# Patient Record
Sex: Male | Born: 1975 | Race: White | Hispanic: No | Marital: Single | Smoking: Current every day smoker
Health system: Southern US, Community
[De-identification: ages and names within clinical notes are randomized; demographics above are authoritative.]

---

## 2020-09-11 ENCOUNTER — Encounter (HOSPITAL_COMMUNITY): Payer: Self-pay

## 2020-09-11 ENCOUNTER — Observation Stay (HOSPITAL_COMMUNITY): Payer: No Typology Code available for payment source | Admitting: Anesthesiology

## 2020-09-11 ENCOUNTER — Observation Stay (HOSPITAL_COMMUNITY): Payer: Self-pay

## 2020-09-11 ENCOUNTER — Emergency Department (HOSPITAL_COMMUNITY): Payer: Self-pay

## 2020-09-11 ENCOUNTER — Observation Stay (HOSPITAL_COMMUNITY): Payer: Self-pay | Attending: Orthopedic Surgery

## 2020-09-11 ENCOUNTER — Encounter (HOSPITAL_COMMUNITY)
Admission: EM | Disposition: A | Discharge status: Home / Self Care | Payer: Self-pay | Source: Home / Self Care | Attending: Emergency Medicine

## 2020-09-11 ENCOUNTER — Other Ambulatory Visit: Payer: Self-pay

## 2020-09-11 ENCOUNTER — Observation Stay (HOSPITAL_COMMUNITY)
Admission: EM | Admit: 2020-09-11 | Discharge: 2020-09-12 | Disposition: A | Discharge status: Home / Self Care | Payer: No Typology Code available for payment source | Attending: Orthopedic Surgery | Admitting: Orthopedic Surgery

## 2020-09-11 DIAGNOSIS — W208XXA Other cause of strike by thrown, projected or falling object, initial encounter: Secondary | ICD-10-CM | POA: Diagnosis not present

## 2020-09-11 DIAGNOSIS — Z20822 Contact with and (suspected) exposure to covid-19: Secondary | ICD-10-CM | POA: Insufficient documentation

## 2020-09-11 DIAGNOSIS — S8991XA Unspecified injury of right lower leg, initial encounter: Secondary | ICD-10-CM | POA: Diagnosis present

## 2020-09-11 DIAGNOSIS — S82301B Unspecified fracture of lower end of right tibia, initial encounter for open fracture type I or II: Secondary | ICD-10-CM | POA: Diagnosis not present

## 2020-09-11 DIAGNOSIS — Y9269 Other specified industrial and construction area as the place of occurrence of the external cause: Secondary | ICD-10-CM | POA: Diagnosis not present

## 2020-09-11 DIAGNOSIS — S82209B Unspecified fracture of shaft of unspecified tibia, initial encounter for open fracture type I or II: Secondary | ICD-10-CM | POA: Diagnosis present

## 2020-09-11 DIAGNOSIS — S82401A Unspecified fracture of shaft of right fibula, initial encounter for closed fracture: Secondary | ICD-10-CM | POA: Insufficient documentation

## 2020-09-11 DIAGNOSIS — Y990 Civilian activity done for income or pay: Secondary | ICD-10-CM | POA: Diagnosis not present

## 2020-09-11 DIAGNOSIS — S82201A Unspecified fracture of shaft of right tibia, initial encounter for closed fracture: Secondary | ICD-10-CM

## 2020-09-11 DIAGNOSIS — Z419 Encounter for procedure for purposes other than remedying health state, unspecified: Secondary | ICD-10-CM

## 2020-09-11 HISTORY — PX: TIBIA IM NAIL INSERTION: SHX2516

## 2020-09-11 HISTORY — PX: I & D EXTREMITY: SHX5045

## 2020-09-11 LAB — CBC WITH DIFFERENTIAL/PLATELET
Abs Immature Granulocytes: 0.06 10*3/uL (ref 0.00–0.07)
Basophils Absolute: 0 10*3/uL (ref 0.0–0.1)
Basophils Relative: 0 %
Eosinophils Absolute: 0.2 10*3/uL (ref 0.0–0.5)
Eosinophils Relative: 1 %
HCT: 41.5 % (ref 39.0–52.0)
Hemoglobin: 14 g/dL (ref 13.0–17.0)
Immature Granulocytes: 1 %
Lymphocytes Relative: 11 %
Lymphs Abs: 1.2 10*3/uL (ref 0.7–4.0)
MCH: 32.3 pg (ref 26.0–34.0)
MCHC: 33.7 g/dL (ref 30.0–36.0)
MCV: 95.8 fL (ref 80.0–100.0)
Monocytes Absolute: 0.6 10*3/uL (ref 0.1–1.0)
Monocytes Relative: 5 %
Neutro Abs: 8.9 10*3/uL — ABNORMAL HIGH (ref 1.7–7.7)
Neutrophils Relative %: 82 %
Platelets: 228 10*3/uL (ref 150–400)
RBC: 4.33 MIL/uL (ref 4.22–5.81)
RDW: 12.4 % (ref 11.5–15.5)
WBC: 10.9 10*3/uL — ABNORMAL HIGH (ref 4.0–10.5)
nRBC: 0 % (ref 0.0–0.2)

## 2020-09-11 LAB — BASIC METABOLIC PANEL
Anion gap: 5 (ref 5–15)
BUN: 14 mg/dL (ref 6–20)
CO2: 25 mmol/L (ref 22–32)
Calcium: 8.7 mg/dL — ABNORMAL LOW (ref 8.9–10.3)
Chloride: 107 mmol/L (ref 98–111)
Creatinine, Ser: 1.16 mg/dL (ref 0.61–1.24)
GFR, Estimated: >60 mL/min (ref >60)
Glucose, Bld: 115 mg/dL — ABNORMAL HIGH (ref 70–99)
Potassium: 4.2 mmol/L (ref 3.5–5.1)
Sodium: 137 mmol/L (ref 135–145)

## 2020-09-11 LAB — SARS CORONAVIRUS 2 (TAT 6-24 HRS): SARS Coronavirus 2: NEGATIVE

## 2020-09-11 SURGERY — INSERTION, INTRAMEDULLARY ROD, TIBIA
Anesthesia: General | Laterality: Right

## 2020-09-11 MED ORDER — OXYCODONE HCL 5 MG PO TABS
5.0000 mg | ORAL_TABLET | ORAL | Status: DC | PRN
  Administered 2020-09-11 – 2020-09-12 (×2): 10 mg via ORAL
  Filled 2020-09-11 (×2): qty 2

## 2020-09-11 MED ORDER — ACETAMINOPHEN 500 MG PO TABS
ORAL_TABLET | ORAL | Status: AC
  Administered 2020-09-11: 1000 mg via ORAL
  Filled 2020-09-11: qty 2

## 2020-09-11 MED ORDER — CHLORHEXIDINE GLUCONATE 4 % EX LIQD: 60.0000 mL | Freq: Once | CUTANEOUS | Status: DC

## 2020-09-11 MED ORDER — EPHEDRINE SULFATE 50 MG/ML IJ SOLN
INTRAMUSCULAR | Status: DC | PRN
  Administered 2020-09-11: 5 mg via INTRAVENOUS

## 2020-09-11 MED ORDER — ORAL CARE MOUTH RINSE: 15.0000 mL | Freq: Once | OROMUCOSAL | Status: AC

## 2020-09-11 MED ORDER — LIDOCAINE HCL (CARDIAC) PF 100 MG/5ML IV SOSY
PREFILLED_SYRINGE | INTRAVENOUS | Status: DC | PRN
  Administered 2020-09-11: 60 mg via INTRATRACHEAL

## 2020-09-11 MED ORDER — MIDAZOLAM HCL 2 MG/2ML IJ SOLN
INTRAMUSCULAR | Status: AC
  Filled 2020-09-11: qty 2

## 2020-09-11 MED ORDER — FENTANYL CITRATE (PF) 250 MCG/5ML IJ SOLN
INTRAMUSCULAR | Status: DC | PRN
  Administered 2020-09-11: 100 ug via INTRAVENOUS
  Administered 2020-09-11: 50 ug via INTRAVENOUS
  Administered 2020-09-11: 100 ug via INTRAVENOUS
  Administered 2020-09-11: 50 ug via INTRAVENOUS
  Administered 2020-09-11: 100 ug via INTRAVENOUS

## 2020-09-11 MED ORDER — ONDANSETRON HCL 4 MG/2ML IJ SOLN
INTRAMUSCULAR | Status: DC | PRN
  Administered 2020-09-11: 4 mg via INTRAVENOUS

## 2020-09-11 MED ORDER — OXYCODONE HCL 5 MG/5ML PO SOLN: 5.0000 mg | Freq: Once | ORAL | Status: DC | PRN

## 2020-09-11 MED ORDER — LIDOCAINE 2% (20 MG/ML) 5 ML SYRINGE
INTRAMUSCULAR | Status: AC
  Filled 2020-09-11: qty 25

## 2020-09-11 MED ORDER — FENTANYL CITRATE (PF) 250 MCG/5ML IJ SOLN
INTRAMUSCULAR | Status: AC
  Filled 2020-09-11: qty 5

## 2020-09-11 MED ORDER — ONDANSETRON HCL 4 MG PO TABS: 4.0000 mg | ORAL_TABLET | Freq: Four times a day (QID) | ORAL | Status: DC | PRN

## 2020-09-11 MED ORDER — HYDROMORPHONE HCL 1 MG/ML IJ SOLN
0.2500 mg | INTRAMUSCULAR | Status: DC | PRN
  Administered 2020-09-11: 0.5 mg via INTRAVENOUS

## 2020-09-11 MED ORDER — PROPOFOL 10 MG/ML IV BOLUS
INTRAVENOUS | Status: DC | PRN
  Administered 2020-09-11: 200 mg via INTRAVENOUS

## 2020-09-11 MED ORDER — ACETAMINOPHEN 500 MG PO TABS
1000.0000 mg | ORAL_TABLET | Freq: Once | ORAL | Status: AC
Start: 2020-09-11 — End: 2020-09-11

## 2020-09-11 MED ORDER — HYDROMORPHONE HCL 1 MG/ML IJ SOLN
1.0000 mg | Freq: Once | INTRAMUSCULAR | Status: AC
  Administered 2020-09-11: 1 mg via INTRAVENOUS
  Filled 2020-09-11: qty 1

## 2020-09-11 MED ORDER — CEFAZOLIN SODIUM-DEXTROSE 2-4 GM/100ML-% IV SOLN
2.0000 g | Freq: Three times a day (TID) | INTRAVENOUS | Status: DC
  Administered 2020-09-12 (×2): 2 g via INTRAVENOUS
  Filled 2020-09-11 (×2): qty 100

## 2020-09-11 MED ORDER — EPHEDRINE 5 MG/ML INJ
INTRAVENOUS | Status: AC
  Filled 2020-09-11: qty 30

## 2020-09-11 MED ORDER — OXYCODONE HCL 5 MG PO TABS: 5.0000 mg | ORAL_TABLET | Freq: Once | ORAL | Status: DC | PRN

## 2020-09-11 MED ORDER — LACTATED RINGERS IV SOLN: INTRAVENOUS | Status: DC | PRN

## 2020-09-11 MED ORDER — CHLORHEXIDINE GLUCONATE 0.12 % MT SOLN: 15.0000 mL | Freq: Once | OROMUCOSAL | Status: AC

## 2020-09-11 MED ORDER — MIDAZOLAM HCL 5 MG/5ML IJ SOLN
INTRAMUSCULAR | Status: DC | PRN
  Administered 2020-09-11: 2 mg via INTRAVENOUS

## 2020-09-11 MED ORDER — PHENYLEPHRINE HCL (PRESSORS) 10 MG/ML IV SOLN
INTRAVENOUS | Status: DC | PRN
  Administered 2020-09-11: 120 ug via INTRAVENOUS

## 2020-09-11 MED ORDER — ONDANSETRON HCL 4 MG/2ML IJ SOLN
INTRAMUSCULAR | Status: AC
  Filled 2020-09-11: qty 8

## 2020-09-11 MED ORDER — MORPHINE SULFATE (PF) 2 MG/ML IV SOLN
2.0000 mg | INTRAVENOUS | Status: DC | PRN
Start: 2020-09-11 — End: 2020-09-12

## 2020-09-11 MED ORDER — PHENYLEPHRINE 40 MCG/ML (10ML) SYRINGE FOR IV PUSH (FOR BLOOD PRESSURE SUPPORT)
PREFILLED_SYRINGE | INTRAVENOUS | Status: AC
  Filled 2020-09-11: qty 60

## 2020-09-11 MED ORDER — PROMETHAZINE HCL 25 MG/ML IJ SOLN: 6.2500 mg | INTRAMUSCULAR | Status: DC | PRN

## 2020-09-11 MED ORDER — KETOROLAC TROMETHAMINE 30 MG/ML IJ SOLN
INTRAMUSCULAR | Status: AC
  Filled 2020-09-11: qty 2

## 2020-09-11 MED ORDER — DEXAMETHASONE SODIUM PHOSPHATE 10 MG/ML IJ SOLN
INTRAMUSCULAR | Status: AC
  Filled 2020-09-11: qty 3

## 2020-09-11 MED ORDER — ROCURONIUM BROMIDE 100 MG/10ML IV SOLN
INTRAVENOUS | Status: DC | PRN
  Administered 2020-09-11: 80 mg via INTRAVENOUS

## 2020-09-11 MED ORDER — SODIUM CHLORIDE 0.9 % IR SOLN
Status: DC | PRN
  Administered 2020-09-11: 1000 mL

## 2020-09-11 MED ORDER — ONDANSETRON HCL 4 MG/2ML IJ SOLN: 4.0000 mg | Freq: Four times a day (QID) | INTRAMUSCULAR | Status: DC | PRN

## 2020-09-11 MED ORDER — DIPHENHYDRAMINE HCL 50 MG/ML IJ SOLN
INTRAMUSCULAR | Status: DC | PRN
  Administered 2020-09-11: 12.5 mg via INTRAVENOUS

## 2020-09-11 MED ORDER — CEFAZOLIN SODIUM-DEXTROSE 2-4 GM/100ML-% IV SOLN
2.0000 g | Freq: Once | INTRAVENOUS | Status: AC
  Administered 2020-09-11: 2 g via INTRAVENOUS
  Filled 2020-09-11: qty 100

## 2020-09-11 MED ORDER — DEXAMETHASONE SODIUM PHOSPHATE 10 MG/ML IJ SOLN
INTRAMUSCULAR | Status: DC | PRN
  Administered 2020-09-11: 10 mg via INTRAVENOUS

## 2020-09-11 MED ORDER — ENOXAPARIN SODIUM 40 MG/0.4ML IJ SOSY
40.0000 mg | PREFILLED_SYRINGE | INTRAMUSCULAR | Status: DC
  Administered 2020-09-12: 40 mg via SUBCUTANEOUS
  Filled 2020-09-11: qty 0.4

## 2020-09-11 MED ORDER — METHOCARBAMOL 500 MG PO TABS: 500.0000 mg | ORAL_TABLET | Freq: Four times a day (QID) | ORAL | Status: DC | PRN

## 2020-09-11 MED ORDER — PROPOFOL 10 MG/ML IV BOLUS
INTRAVENOUS | Status: AC
  Filled 2020-09-11: qty 20

## 2020-09-11 MED ORDER — CEFAZOLIN SODIUM-DEXTROSE 2-4 GM/100ML-% IV SOLN
INTRAVENOUS | Status: AC
  Filled 2020-09-11: qty 100

## 2020-09-11 MED ORDER — CEFAZOLIN SODIUM-DEXTROSE 2-4 GM/100ML-% IV SOLN
2.0000 g | INTRAVENOUS | Status: AC
  Administered 2020-09-11: 2 g via INTRAVENOUS

## 2020-09-11 MED ORDER — HYDROMORPHONE HCL 1 MG/ML IJ SOLN
INTRAMUSCULAR | Status: AC
  Filled 2020-09-11: qty 1

## 2020-09-11 MED ORDER — MORPHINE SULFATE (PF) 4 MG/ML IV SOLN
4.0000 mg | Freq: Once | INTRAVENOUS | Status: AC
  Administered 2020-09-11: 4 mg via INTRAVENOUS
  Filled 2020-09-11: qty 1

## 2020-09-11 MED ORDER — SUCCINYLCHOLINE CHLORIDE 200 MG/10ML IV SOSY
PREFILLED_SYRINGE | INTRAVENOUS | Status: AC
  Filled 2020-09-11: qty 40

## 2020-09-11 MED ORDER — LACTATED RINGERS IV SOLN: INTRAVENOUS | Status: DC

## 2020-09-11 MED ORDER — ROCURONIUM BROMIDE 10 MG/ML (PF) SYRINGE
PREFILLED_SYRINGE | INTRAVENOUS | Status: AC
  Filled 2020-09-11: qty 30

## 2020-09-11 MED ORDER — KETOROLAC TROMETHAMINE 30 MG/ML IJ SOLN
30.0000 mg | Freq: Once | INTRAMUSCULAR | Status: AC | PRN
  Administered 2020-09-11: 30 mg via INTRAVENOUS

## 2020-09-11 MED ORDER — CHLORHEXIDINE GLUCONATE 0.12 % MT SOLN
OROMUCOSAL | Status: AC
  Administered 2020-09-11: 15 mL via OROMUCOSAL
  Filled 2020-09-11: qty 15

## 2020-09-11 MED ORDER — SUGAMMADEX SODIUM 200 MG/2ML IV SOLN
INTRAVENOUS | Status: DC | PRN
  Administered 2020-09-11: 200 mg via INTRAVENOUS

## 2020-09-11 MED ORDER — POVIDONE-IODINE 10 % EX SWAB
2.0000 "application " | Freq: Once | CUTANEOUS | Status: AC
  Administered 2020-09-11: 2 via TOPICAL

## 2020-09-11 MED ORDER — KETOROLAC TROMETHAMINE 30 MG/ML IJ SOLN
INTRAMUSCULAR | Status: AC
  Filled 2020-09-11: qty 1

## 2020-09-11 MED ORDER — METHOCARBAMOL 1000 MG/10ML IJ SOLN
500.0000 mg | Freq: Four times a day (QID) | INTRAVENOUS | Status: DC | PRN
  Filled 2020-09-11: qty 5

## 2020-09-11 MED ORDER — PROPOFOL 1000 MG/100ML IV EMUL
INTRAVENOUS | Status: AC
  Filled 2020-09-11: qty 100

## 2020-09-11 MED ORDER — MEPERIDINE HCL 25 MG/ML IJ SOLN: 6.2500 mg | INTRAMUSCULAR | Status: DC | PRN

## 2020-09-11 SURGICAL SUPPLY — 104 items
ALCOHOL 70% 16 OZ (MISCELLANEOUS) ×2 IMPLANT
BAG COUNTER SPONGE SURGICOUNT (BAG) ×2 IMPLANT
BANDAGE ESMARK 6X9 LF (GAUZE/BANDAGES/DRESSINGS) ×1 IMPLANT
BIT DRILL CALIBRATED 4.2 (BIT) ×1 IMPLANT
BIT DRILL SHORT 4.2 (BIT) ×2 IMPLANT
BLADE SURG 10 STRL SS (BLADE) ×2 IMPLANT
BNDG COHESIVE 1X5 TAN STRL LF (GAUZE/BANDAGES/DRESSINGS) IMPLANT
BNDG COHESIVE 4X5 TAN STRL (GAUZE/BANDAGES/DRESSINGS) ×2 IMPLANT
BNDG COHESIVE 6X5 TAN STRL LF (GAUZE/BANDAGES/DRESSINGS) ×4 IMPLANT
BNDG CONFORM 3 STRL LF (GAUZE/BANDAGES/DRESSINGS) IMPLANT
BNDG ELASTIC 3X5.8 VLCR STR LF (GAUZE/BANDAGES/DRESSINGS) IMPLANT
BNDG ELASTIC 4X5.8 VLCR STR LF (GAUZE/BANDAGES/DRESSINGS) ×2 IMPLANT
BNDG ELASTIC 6X10 VLCR STRL LF (GAUZE/BANDAGES/DRESSINGS) ×4 IMPLANT
BNDG ELASTIC 6X5.8 VLCR STR LF (GAUZE/BANDAGES/DRESSINGS) ×2 IMPLANT
BNDG ESMARK 6X9 LF (GAUZE/BANDAGES/DRESSINGS) ×2
BNDG GAUZE ELAST 4 BULKY (GAUZE/BANDAGES/DRESSINGS) ×6 IMPLANT
CORD BIPOLAR FORCEPS 12FT (ELECTRODE) IMPLANT
COVER MAYO STAND STRL (DRAPES) ×2 IMPLANT
COVER SURGICAL LIGHT HANDLE (MISCELLANEOUS) ×2 IMPLANT
CUFF TOURN SGL QUICK 24 (TOURNIQUET CUFF)
CUFF TOURN SGL QUICK 34 (TOURNIQUET CUFF) ×2
CUFF TOURN SGL QUICK 42 (TOURNIQUET CUFF) IMPLANT
CUFF TRNQT CYL 24X4X16.5-23 (TOURNIQUET CUFF) IMPLANT
CUFF TRNQT CYL 34X4.125X (TOURNIQUET CUFF) ×2 IMPLANT
DRAPE C-ARM 42X72 X-RAY (DRAPES) ×2 IMPLANT
DRAPE C-ARMOR (DRAPES) ×2 IMPLANT
DRAPE EXTREMITY BILATERAL (DRAPES) IMPLANT
DRAPE HALF SHEET 40X57 (DRAPES) ×2 IMPLANT
DRAPE IMP U-DRAPE 54X76 (DRAPES) ×2 IMPLANT
DRAPE INCISE IOBAN 66X45 STRL (DRAPES) ×8 IMPLANT
DRAPE POUCH INSTRU U-SHP 10X18 (DRAPES) ×2 IMPLANT
DRAPE SURG 17X23 STRL (DRAPES) IMPLANT
DRAPE U-SHAPE 47X51 STRL (DRAPES) ×2 IMPLANT
DRAPE UTILITY XL STRL (DRAPES) ×4 IMPLANT
DRILL BIT CALIBRATED 4.2 (BIT) ×2
DRILL BIT SHORT 4.2 (BIT) ×2
DRSG ADAPTIC 3X8 NADH LF (GAUZE/BANDAGES/DRESSINGS) ×4 IMPLANT
DRSG PAD ABDOMINAL 8X10 ST (GAUZE/BANDAGES/DRESSINGS) ×2 IMPLANT
DURAPREP 26ML APPLICATOR (WOUND CARE) ×2 IMPLANT
ELECT CAUTERY BLADE 6.4 (BLADE) ×2 IMPLANT
ELECT REM PT RETURN 9FT ADLT (ELECTROSURGICAL) ×2
ELECTRODE REM PT RTRN 9FT ADLT (ELECTROSURGICAL) ×1 IMPLANT
FACESHIELD WRAPAROUND (MASK) ×4 IMPLANT
GAUZE SPONGE 4X4 12PLY STRL (GAUZE/BANDAGES/DRESSINGS) ×2 IMPLANT
GAUZE XEROFORM 1X8 LF (GAUZE/BANDAGES/DRESSINGS) IMPLANT
GAUZE XEROFORM 5X9 LF (GAUZE/BANDAGES/DRESSINGS) IMPLANT
GLOVE SRG 8 PF TXTR STRL LF DI (GLOVE) ×2 IMPLANT
GLOVE SURG ENC MOIS LTX SZ7.5 (GLOVE) ×4 IMPLANT
GLOVE SURG UNDER POLY LF SZ8 (GLOVE) ×2
GOWN STRL REUS W/ TWL LRG LVL3 (GOWN DISPOSABLE) ×2 IMPLANT
GOWN STRL REUS W/ TWL XL LVL3 (GOWN DISPOSABLE) ×2 IMPLANT
GOWN STRL REUS W/TWL LRG LVL3 (GOWN DISPOSABLE) ×2
GOWN STRL REUS W/TWL XL LVL3 (GOWN DISPOSABLE) ×2
GUIDEWIRE 3.2X400 (WIRE) ×2 IMPLANT
HANDPIECE INTERPULSE COAX TIP (DISPOSABLE)
KIT BASIN OR (CUSTOM PROCEDURE TRAY) ×2 IMPLANT
KIT TURNOVER KIT B (KITS) ×2 IMPLANT
MANIFOLD NEPTUNE II (INSTRUMENTS) ×2 IMPLANT
NAIL TIB TFNA 9X345 (Nail) ×4 IMPLANT
NS IRRIG 1000ML POUR BTL (IV SOLUTION) ×4 IMPLANT
PACK ORTHO EXTREMITY (CUSTOM PROCEDURE TRAY) ×2 IMPLANT
PACK TOTAL JOINT (CUSTOM PROCEDURE TRAY) ×2 IMPLANT
PAD ARMBOARD 7.5X6 YLW CONV (MISCELLANEOUS) ×4 IMPLANT
PAD CAST 4YDX4 CTTN HI CHSV (CAST SUPPLIES) ×2 IMPLANT
PADDING CAST ABS 4INX4YD NS (CAST SUPPLIES)
PADDING CAST ABS COTTON 4X4 ST (CAST SUPPLIES) IMPLANT
PADDING CAST COTTON 4X4 STRL (CAST SUPPLIES) ×2
PADDING CAST COTTON 6X4 STRL (CAST SUPPLIES) ×4 IMPLANT
PADDING CAST SYN 6 (CAST SUPPLIES) ×2
PADDING CAST SYNTHETIC 6X4 NS (CAST SUPPLIES) ×2 IMPLANT
REAMER ROD DEEP FLUTE 2.5X950 (INSTRUMENTS) ×2 IMPLANT
SCREW LOCK HDLS 5X32 (Screw) ×2 IMPLANT
SCREW LOCK HDLS 5X40 STRL (Screw) ×2 IMPLANT
SCREW LOCK HDLS 5X46 (Screw) ×2 IMPLANT
SCREW LOCK HDLS 5X58 (Screw) ×2 IMPLANT
SET CYSTO W/LG BORE CLAMP LF (SET/KITS/TRAYS/PACK) ×2 IMPLANT
SET HNDPC FAN SPRY TIP SCT (DISPOSABLE) IMPLANT
SLEEVE PROTECTIVE FLEX NL 8-13 (ORTHOPEDIC DISPOSABLE SUPPLIES) ×2 IMPLANT
SPONGE T-LAP 18X18 ~~LOC~~+RFID (SPONGE) ×4 IMPLANT
STAPLER SKIN PROX WIDE 3.9 (STAPLE) ×2 IMPLANT
STAPLER VISISTAT 35W (STAPLE) ×2 IMPLANT
STOCKINETTE IMPERVIOUS 9X36 MD (GAUZE/BANDAGES/DRESSINGS) ×2 IMPLANT
SUT ETHILON 2 0 FS 18 (SUTURE) IMPLANT
SUT ETHILON 2 0 PSLX (SUTURE) IMPLANT
SUT ETHILON 3 0 PS 1 (SUTURE) ×2 IMPLANT
SUT MON AB 2-0 CT1 36 (SUTURE) ×2 IMPLANT
SUT VIC AB 0 CT1 27 (SUTURE) ×2
SUT VIC AB 0 CT1 27XBRD ANBCTR (SUTURE) ×2 IMPLANT
SUT VIC AB 1 CT1 27 (SUTURE) ×1
SUT VIC AB 1 CT1 27XBRD ANBCTR (SUTURE) ×1 IMPLANT
SUT VIC AB 2-0 CT1 27 (SUTURE) ×1
SUT VIC AB 2-0 CT1 36 (SUTURE) IMPLANT
SUT VIC AB 2-0 CT1 TAPERPNT 27 (SUTURE) ×1 IMPLANT
SUT VIC AB 2-0 FS1 27 (SUTURE) IMPLANT
SWAB CULTURE ESWAB REG 1ML (MISCELLANEOUS) IMPLANT
SYR CONTROL 10ML LL (SYRINGE) IMPLANT
TOWEL GREEN STERILE (TOWEL DISPOSABLE) ×4 IMPLANT
TOWEL GREEN STERILE FF (TOWEL DISPOSABLE) ×2 IMPLANT
TROCAR LONG FOR NAIL 8-13 SILE (TROCAR) ×2 IMPLANT
TUBE CONNECTING 12X1/4 (SUCTIONS) ×2 IMPLANT
TUBE FEEDING ENTERAL 5FR 16IN (TUBING) IMPLANT
UNDERPAD 30X36 HEAVY ABSORB (UNDERPADS AND DIAPERS) ×4 IMPLANT
WATER STERILE IRR 1000ML POUR (IV SOLUTION) ×2 IMPLANT
YANKAUER SUCT BULB TIP NO VENT (SUCTIONS) ×2 IMPLANT

## 2020-09-11 NOTE — Care Management (Signed)
Received call from Gardiner Coins [facility]  with Workers Comp  [phone]. She is requesting an update on discharge plan.

## 2020-09-11 NOTE — ED Notes (Signed)
Pt signed consent form for procedure & was witnessed by this RN.

## 2020-09-11 NOTE — Discharge Instructions (Signed)
Orthopedic surgery discharge instructions:  Okay for full weightbearing as tolerated to the right lower extremity.  -Maintain postoperative bandages for 3 to 4 days.  You may then remove and replace with daily dry dressings.  You should expect moderate drainage from all surgical sites.  This will resolve after 3 to 4 days.  -Apply ice to the right knee and lower leg as needed around-the-clock for 20 to 30 minutes at a time.  -When up and walking you should wear the cam boot on the right ankle.  When in the bed and doing ankle range of motion and knee exercises you can discontinue the boot.  -He should take an 81 mg aspirin once per day x6 weeks to prevent blood clots.  -For mild to moderate pain use Tylenol and Advil around-the-clock.  For any breakthrough pain use oxycodone as necessary.

## 2020-09-11 NOTE — ED Notes (Signed)
Pt is removing extra clothing articles & giving belongings to family at bedside.

## 2020-09-11 NOTE — Progress Notes (Signed)
Orthopedic Tech Progress Note Patient Details:  Hollister Wessler [birth year] [id number]  Ortho Devices Type of Ortho Device: Short leg splint, Stirrup splint Ortho Device/Splint Location: Right Leg Ortho Device/Splint Interventions: Application   Post Interventions Patient Tolerated: Well  Rhenda Oregon E Chrysten Woulfe 09/11/2020, 11:17 AM

## 2020-09-11 NOTE — Progress Notes (Signed)
Orthopedic Tech Progress Note Patient Details:  Nathan Whitney April 15, 1975 [id number]  Ortho Devices Type of Ortho Device: CAM walker Ortho Device/Splint Location: Right Leg Ortho Device/Splint Interventions: Application   Post Interventions Patient Tolerated: Well  [name] 09/11/2020, 6:47 PM

## 2020-09-11 NOTE — ED Triage Notes (Signed)
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 900 lb (per pt) fell from approx 4 ft and onto his Rt leg. His leg was pinned longer than 3 min (per pt) until FD arrived and removed the counterweight & isolated the leg. EMS reports obvious deformity to Rt leg, pulse good, has limited toe movement. A/Ox4, verbal-able to make needs known. 20G LtAC. 140/90, 99% on RA, 87 bpm, CBG 159.

## 2020-09-11 NOTE — Brief Op Note (Signed)
09/11/2020  6:21 PM  PATIENT:  Nathan Whitney  45 y.o. male  PRE-OPERATIVE DIAGNOSIS:  Right type I open tibia fracture 2. Right closed fibula midshaft fracture.  POST-OPERATIVE DIAGNOSIS: same  PROCEDURE:  Procedure(s): INTRAMEDULLARY (IM) NAIL TIBIAL (Right) IRRIGATION AND DEBRIDEMENT Tibia (Right)  SURGEON:  Surgeon(s) and Role:    * Yolonda Kida, MD - Primary  PHYSICIAN ASSISTANT: Dion Saucier, PA-C  ANESTHESIA:   general  EBL:  100 mL   BLOOD ADMINISTERED:none  DRAINS: none   LOCAL MEDICATIONS USED:  NONE  SPECIMEN:  No Specimen  DISPOSITION OF SPECIMEN:  N/A  COUNTS:  YES  TOURNIQUET:  * Missing tourniquet times found for documented tourniquets in log: [id number] *  DICTATION: .Note written in EPIC  PLAN OF CARE: Admit for overnight observation  PATIENT DISPOSITION:  PACU - hemodynamically stable.   Delay start of Pharmacological VTE agent (>24hrs) due to surgical blood loss or risk of bleeding: not applicable

## 2020-09-11 NOTE — ED Notes (Signed)
Patient transported to X-ray 

## 2020-09-11 NOTE — Anesthesia Procedure Notes (Signed)
Procedure Name: Intubation Date/Time: 09/11/2020 4:34 PM Performed by: Claudina Lick, CRNA Pre-anesthesia Checklist: Patient identified, Emergency Drugs available, Suction available and Patient being monitored Patient Re-evaluated:Patient Re-evaluated prior to induction Oxygen Delivery Method: Circle system utilized Preoxygenation: Pre-oxygenation with 100% oxygen Induction Type: IV induction Ventilation: Mask ventilation without difficulty Laryngoscope Size: Miller and 2 Grade View: Grade I Tube type: Oral Tube size: 7.5 mm Number of attempts: 1 Airway Equipment and Method: Stylet Placement Confirmation: ETT inserted through vocal cords under direct vision, positive ETCO2 and breath sounds checked- equal and bilateral Secured at: 23 cm Tube secured with: Tape Dental Injury: Teeth and Oropharynx as per pre-operative assessment

## 2020-09-11 NOTE — Anesthesia Preprocedure Evaluation (Addendum)
Anesthesia Evaluation  Patient identified by MRN, date of birth, ID band Patient awake    Reviewed: Allergy & Precautions, NPO status , Patient's Chart, lab work & pertinent test results  Airway Mallampati: III  TM Distance: >3 FB Neck ROM: Full    Dental no notable dental hx. (+) Poor Dentition, Dental Advisory Given,    Pulmonary Current Smoker,  <1ppd, no inhalers   Pulmonary exam normal breath sounds clear to auscultation       Cardiovascular negative cardio ROS Normal cardiovascular exam Rhythm:Regular Rate:Normal     Neuro/Psych negative neurological ROS  negative psych ROS   GI/Hepatic negative GI ROS, Neg liver ROS,   Endo/Other  negative endocrine ROS  Renal/GU negative Renal ROS  negative genitourinary   Musculoskeletal Tibial fx   Abdominal   Peds  Hematology negative hematology ROS (+) hct 41.5   Anesthesia Other Findings 900# weight fell onto his right leg  Reproductive/Obstetrics negative OB ROS                           Anesthesia Physical Anesthesia Plan  ASA: 1  Anesthesia Plan: General   Post-op Pain Management:    Induction: Intravenous  PONV Risk Score and Plan: 2 and Ondansetron, Dexamethasone, Midazolam and Treatment may vary due to age or medical condition  Airway Management Planned: Oral ETT  Additional Equipment: None  Intra-op Plan:   Post-operative Plan: Extubation in OR  Informed Consent: I have reviewed the patients History and Physical, chart, labs and discussed the procedure including the risks, benefits and alternatives for the proposed anesthesia with the patient or authorized representative who has indicated his/her understanding and acceptance.     Dental advisory given  Plan Discussed with: CRNA  Anesthesia Plan Comments:        Anesthesia Quick Evaluation

## 2020-09-11 NOTE — Consult Note (Signed)
Reason for Consult:Right tib/fib fx Referring Physician: Bruce Donath Time called: 0908 Time at bedside: 1027   Nathan Whitney is an 45 y.o. male.  HPI: Nathan Whitney was working when a 900# weight fell onto his right leg. He had immediate pain and could not get up or bear weight. He was brought to the ED where x-rays showed a tib/fib fx and orthopedic surgery was consulted. He had a laceration over the fracture and so EDP concerned it is an open fx. He works as a Education administrator for [employer].  History reviewed. No pertinent past medical history.   History reviewed. No pertinent family history.  Social History:  has no history on file for tobacco use, alcohol use, and drug use.  Allergies: Not on File  Medications: I have reviewed the patient's current medications.  Results for orders placed or performed during the hospital encounter of 09/11/20 (from the past 48 hour(s))  Basic metabolic panel     Status: Abnormal   Collection Time: 09/11/20  8:18 AM  Result Value Ref Range   Sodium 137 135 - 145 mmol/L   Potassium 4.2 3.5 - 5.1 mmol/L   Chloride 107 98 - 111 mmol/L   CO2 25 22 - 32 mmol/L   Glucose, Bld 115 (H) 70 - 99 mg/dL    Comment: Glucose reference range applies only to samples taken after fasting for at least 8 hours.   BUN 14 6 - 20 mg/dL   Creatinine, Ser 7.01 0.61 - 1.24 mg/dL   Calcium 8.7 (L) 8.9 - 10.3 mg/dL   GFR, Estimated >77 >93 mL/min    Comment: (NOTE) Calculated using the CKD-EPI Creatinine Equation (2021)    Anion gap 5 5 - 15    Comment: Performed at [facility], [address] [place], [place] [address]  CBC with Differential     Status: Abnormal   Collection Time: 09/11/20  8:18 AM  Result Value Ref Range   WBC 10.9 (H) 4.0 - 10.5 K/uL   RBC 4.33 4.22 - 5.81 MIL/uL   Hemoglobin 14.0 13.0 - 17.0 g/dL   HCT 92.3 30.0 - 76.2 %   MCV 95.8 80.0 - 100.0 fL   MCH 32.3 26.0 - 34.0 pg   MCHC 33.7 30.0 - 36.0 g/dL   RDW 26.3 33.5 - 45.6 %   Platelets 228 150 -  400 K/uL   nRBC 0.0 0.0 - 0.2 %   Neutrophils Relative % 82 %   Neutro Abs 8.9 (H) 1.7 - 7.7 K/uL   Lymphocytes Relative 11 %   Lymphs Abs 1.2 0.7 - 4.0 K/uL   Monocytes Relative 5 %   Monocytes Absolute 0.6 0.1 - 1.0 K/uL   Eosinophils Relative 1 %   Eosinophils Absolute 0.2 0.0 - 0.5 K/uL   Basophils Relative 0 %   Basophils Absolute 0.0 0.0 - 0.1 K/uL   Immature Granulocytes 1 %   Abs Immature Granulocytes 0.06 0.00 - 0.07 K/uL    Comment: Performed at [facility], [address] [place], [place] [address]    DG Tibia/Fibula Right  Result Date: 09/11/2020 CLINICAL DATA:  Crush injury of right lower leg. EXAM: RIGHT TIBIA AND FIBULA - 2 VIEW COMPARISON:  None. FINDINGS: Comminuted and severely displaced fractures are seen involving the distal right tibia and fibula. IMPRESSION: Comminuted and displaced distal right tibial and fibular fractures. Electronically Signed   By: Lupita Raider M.D.   On: 09/11/2020 08:53   DG Ankle  Complete Right  Result Date: 09/11/2020 CLINICAL DATA:  Crush injury to right lower leg. EXAM: RIGHT ANKLE - COMPLETE 3+ VIEW COMPARISON:  None. FINDINGS: Severely displaced and comminuted fractures are seen involving the distal right tibia and fibula. IMPRESSION: Displaced and comminuted distal right tibial and fibular fractures. Electronically Signed   By: Lupita Raider M.D.   On: 09/11/2020 08:54    Review of Systems  HENT:  Negative for ear discharge, ear pain, hearing loss and tinnitus.   Eyes:  Negative for photophobia and pain.  Respiratory:  Negative for cough and shortness of breath.   Cardiovascular:  Negative for chest pain.  Gastrointestinal:  Negative for abdominal pain, nausea and vomiting.  Genitourinary:  Negative for dysuria, flank pain, frequency and urgency.  Musculoskeletal:  Positive for arthralgias (Right lower leg). Negative for back pain, myalgias and neck pain.  Neurological:  Negative for dizziness and headaches.   Hematological:  Does not bruise/bleed easily.  Psychiatric/Behavioral:  The patient is not nervous/anxious.   Blood pressure (!) 137/102, pulse 84, resp. rate (!) 21, height 5\' 10"  (1.778 m), weight 81.6 kg, SpO2 97 %. Physical Exam Constitutional:      General: He is not in acute distress.    Appearance: He is well-developed. He is not diaphoretic.  HENT:     Head: Normocephalic and atraumatic.  Eyes:     General: No scleral icterus.       Right eye: No discharge.        Left eye: No discharge.     Conjunctiva/sclera: Conjunctivae normal.  Cardiovascular:     Rate and Rhythm: Normal rate and regular rhythm.  Pulmonary:     Effort: Pulmonary effort is normal. No respiratory distress.  Musculoskeletal:     Cervical back: Normal range of motion.     Comments: RLE Transverse laceration mid-shin, no ecchymosis or rash  Mod TTP  No knee effusion, mod medial ankle edema  Knee stable to varus/ valgus and anterior/posterior stress  Sens DPN, SPN, TN intact  Motor EHL, ext, flex, evers 5/5  DP 1+, PT 0, No significant edema  Skin:    General: Skin is warm and dry.  Neurological:     Mental Status: He is alert.  Psychiatric:        Mood and Affect: Mood normal.        Behavior: Behavior normal.    Assessment/Plan: Right tib/fib fx, poss open -- Plan I&D, IMN today by Dr. . Please keep NPO.    Aundria Rud, PA-C Orthopedic Surgery [phone] 09/11/2020, 10:34 AM

## 2020-09-11 NOTE — ED Notes (Signed)
Ortho Tech was paged & responded. Waiting for their assistance to apply ordered leg splint.

## 2020-09-11 NOTE — ED Notes (Signed)
Ortho at bedside was assisted by this RN to remove the leg splint applied by EMS, pt tolerated well.

## 2020-09-11 NOTE — Op Note (Signed)
Date of Surgery: 09/11/2020  INDICATIONS: Mr. Barot is a 45 y.o.-year-old male who was involved in a work-related accident prior to arrival, and sustained a right type I open tibia fracture, and associated midshaft fibula fracture.  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.  He states that it approximate 800 to 900 pound counterweight from a forklift fell onto his right leg.  The load was offset a little bit by some adjacent metal on the ground but he did have immediate pain and deformity.  He was found in the ER to have oozing wound in the anterior lower leg with associated deformity and x-rays consistent with the above.  The risks and benefits of the procedure discussed with the Patient prior to the procedure and all questions were answered; consent was obtained.  PREOPERATIVE DIAGNOSIS:   Right type I open tibia midshaft fracture Right fibula midshaft fracture  POSTOPERATIVE DIAGNOSIS: Same  PROCEDURE:   Right tibia open irrigation and excisional debridement for open fracture including skin, subcutaneous tissue, muscle, and bone. 2. right tibia closed reduction and intramedullary nailing CPT: 27759  3. closed treatment of left fibular shaft fracture with manipulation, CPT - 01027  SURGEON: Maryan Rued, M.D.  ASSISTANT: Dion Saucier, PA-C.  ANESTHESIA:  general  IV FLUIDS AND URINE: See anesthesia record.  ESTIMATED BLOOD LOSS: 100 mL.  IMPLANTS:  Synthes tibial nail 9 mm x 345 mm 2 proximal 5.0 mm interlocking bolts 2 distal 5.0 mm interlocking bolts   DRAINS: None.  COMPLICATIONS: None.  Tourniquet: None  DESCRIPTION OF PROCEDURE: The patient was brought to the operating room and placed supine on the operating table.  The patient's leg had been signed prior to the Procedure, by myself in the preoperative holding area.  The patient had the anesthesia placed by the anesthesiologist.  The prep verification and incision time-outs were performed to  confirm that this was the correct patient, site, side and location. The patient had an SCD on the opposite lower extremity. The patient did receive antibiotics prior to the incision and was re-dosed during the procedure as needed at indicated intervals.   The patient had the lower extremity prepped and draped in the standard surgical fashion.   We began the procedure with the excisional debridement for open fracture. Debridement type: Excisional Debridement  Side: right  Body Location: tibia   Tools used for debridement: scalpel, scissors, and curette  Pre-debridement Wound size (cm):   Length: 1 cm        Width: 0.5     Depth: 0.5   Post-debridement Wound size (cm):   Length: 6cm        Width: 3 cm     Depth: 2 cm   Debridement depth beyond dead/damaged tissue down to healthy viable tissue: yes  Tissue layer involved: skin, subcutaneous tissue, muscle / fascia, bone  Nature of tissue removed: Devitalized Tissue and Non-viable tissue  Irrigation volume: 3 L     Irrigation fluid type: Normal Saline  We then turned our attention to fixation of the tibia.   The incision was first made over the quadriceps tendon in the midline and taken down to the skin and subcutaneous tissue to expose the peritenon. The peritenon was incised in line with the skin incision and then a poke hole was made in the quadriceps tendon in the midline.  A knife was then used to longitudinally divide the tendon in line with its fibers, taking care not to cross over  any fibers. Then the guide wire was placed, utilizing the suprapatellar approach and sleeve instrumentation, at the proximal, anterior tibia, confirming its location on both AP and lateral views. The wire was drilled into the bone and then the opening reamer was placed over this and maneuvered so that the reamer was parallel with anterior cortex of the tibia. The ball-tipped guide wire was then placed down into the canal towards the fracture site. The  fracture was reduced, care was taken to confirm reduction on both AP and lateral view, and the wire was passed and confirmed to be in the proper location on both AP and lateral views.  The measuring stick was used to measure the length of the nail.  Sequential reaming was then performed, initial chatter was heard with the 8 mm opening reamer, and we sequentially reamed up to a 10.0 mm reamer to accept a 9 mm nail.  Then the nail was gently hammered into place over the guide wire and the guide wire was removed. The proximal screws were placed through the interlocking drill guide using the sleeve. The distal screws were placed using the perfect circles technique. All screws were placed in the standard fashion, first incising the skin and then spreading with a tonsil, then drilling, measuring with a depth gauge, and then placing the screws by hand.  The final x-rays were taken in both AP and lateral views to confirm the fracture reduction as well as the placement of all hardware.   The fibula fracture was treated in a closed manner.    The wounds were copiously irrigated with saline and then the peritenon of the quadriceps tendon was closed with 0 Vicryl figure-of-eight interrupted sutures.  2-0 Vicryl, was used to close the subcutaneous layer.  Staples were then used to close all of the open incision wounds.  Traumatic wound was closed with interrupted horizontal mattress 3-0 nylon.  The wounds were cleaned and dried a final time and a sterile dressing was placed.   The patient's calf was soft to palpation at the end of the case.  There was no compartment syndrome.  The patient was then transferred to a bed and taken to the recovery room in stable condition.  All counts were correct at the end of the case.  He will be placed in a cam boot in PACU prior to returning to the floor.   POSTOPERATIVE PLAN: Nathan Whitney will be WBAT and will return 2 weeks for suture removal.  Mr. Whidbee will receive DVT  prophylaxis daily 81 mg aspirin x6 weeks.  Duwayne Heck, MD [phone] [place], [place].

## 2020-09-11 NOTE — Transfer of Care (Signed)
Immediate Anesthesia Transfer of Care Note  Patient: Nathan Whitney  Procedure(s) Performed: INTRAMEDULLARY (IM) NAIL TIBIAL (Right) IRRIGATION AND DEBRIDEMENT Tibia (Right)  Patient Location: PACU  Anesthesia Type:General  Level of Consciousness: drowsy  Airway & Oxygen Therapy: Patient Spontanous Breathing and Patient connected to face mask oxygen  Post-op Assessment: Report given to RN and Post -op Vital signs reviewed and stable  Post vital signs: Reviewed and stable  Last Vitals:  Vitals Value Taken Time  BP 144/103 09/11/20 1827  Temp    Pulse 101 09/11/20 1827  Resp 17 09/11/20 1827  SpO2 100 % 09/11/20 1827  Vitals shown include unvalidated device data.  Last Pain:  Vitals:   09/11/20 1446  TempSrc: Oral  PainSc: 4       Patients Stated Pain Goal: 3 (09/11/20 1446)  Complications: No notable events documented.

## 2020-09-11 NOTE — ED Notes (Signed)
Ortho at bedside applying splint

## 2020-09-11 NOTE — ED Notes (Signed)
Rt leg wound cleaned, pt tol well.

## 2020-09-11 NOTE — H&P (Signed)
Attestation signed by Yolonda Kida, MD at 09/11/2020  3:27 PM   I have examined the patient, reviewed the note as outlined by Nathan Hamburg, PA-C, and also discussed directly with Nathan Whitney.  I agree with his assessment and plan.   An open fracture that will need urgent management.  Plan will be for irrigation debridement with definitive fixation of the fracture with intramedullary nail.  Patient will be admitted postoperatively for 24 hours of IV antibiotics and physical therapy.         Expand All Collapse All                                                                                                                                                                                                                      Reason for Consult:Right tib/fib fx Referring Physician: Bruce Donath Time called: 0908 Time at bedside: 1027     Nathan Whitney is an 45 y.o. male. HPI: [name] was working when a 900# weight fell onto his right leg. He had immediate pain and could not get up or bear weight. He was brought to the ED where x-rays showed a tib/fib fx and orthopedic surgery was consulted. He had a laceration over the fracture and so EDP concerned it is an open fx. He works as a Education administrator for [employer].   History reviewed. No pertinent past medical history.     History reviewed. No pertinent family history.   Social History:  has no history on file for tobacco use, alcohol use, and drug use.   Allergies: Not on File   Medications: I have reviewed the patient's current medications.   Lab Results Last 48 Hours        Results for orders placed or performed during the hospital encounter of 09/11/20 (from the past 48 hour(s))  Basic metabolic panel     Status: Abnormal    Collection Time: 09/11/20  8:18 AM  Result Value Ref Range    Sodium 137 135 - 145 mmol/L    Potassium 4.2 3.5 - 5.1 mmol/L    Chloride 107 98 -  111 mmol/L    CO2 25 22 - 32 mmol/L    Glucose, Bld 115 (H) 70 - 99 mg/dL      Comment: Glucose reference range applies only to samples taken after fasting for at least 8 hours.    BUN 14 6 - 20 mg/dL    Creatinine, Ser 6.28 0.61 - 1.24 mg/dL    Calcium  8.7 (L) 8.9 - 10.3 mg/dL    GFR, Estimated >76 >28 mL/min      Comment: (NOTE) Calculated using the CKD-EPI Creatinine Equation (2021)      Anion gap 5 5 - 15      Comment: Performed at [facility], [address], [place], [place] [address]  CBC with Differential     Status: Abnormal    Collection Time: 09/11/20  8:18 AM  Result Value Ref Range    WBC 10.9 (H) 4.0 - 10.5 K/uL    RBC 4.33 4.22 - 5.81 MIL/uL    Hemoglobin 14.0 13.0 - 17.0 g/dL    HCT 61.6 07.3 - 71.0 %    MCV 95.8 80.0 - 100.0 fL    MCH 32.3 26.0 - 34.0 pg    MCHC 33.7 30.0 - 36.0 g/dL    RDW 62.6 94.8 - 54.6 %    Platelets 228 150 - 400 K/uL    nRBC 0.0 0.0 - 0.2 %    Neutrophils Relative % 82 %    Neutro Abs 8.9 (H) 1.7 - 7.7 K/uL    Lymphocytes Relative 11 %    Lymphs Abs 1.2 0.7 - 4.0 K/uL    Monocytes Relative 5 %    Monocytes Absolute 0.6 0.1 - 1.0 K/uL    Eosinophils Relative 1 %    Eosinophils Absolute 0.2 0.0 - 0.5 K/uL    Basophils Relative 0 %    Basophils Absolute 0.0 0.0 - 0.1 K/uL    Immature Granulocytes 1 %    Abs Immature Granulocytes 0.06 0.00 - 0.07 K/uL      Comment: Performed at [facility], [address], [place], [place] [address]         Imaging Results (Last 48 hours)  DG Tibia/Fibula Right   Result Date: 09/11/2020 CLINICAL DATA:  Crush injury of right lower leg. EXAM: RIGHT TIBIA AND FIBULA - 2 VIEW COMPARISON:  None. FINDINGS: Comminuted and severely displaced fractures are seen involving the distal right tibia and fibula. IMPRESSION: Comminuted and displaced distal right tibial and fibular fractures. Electronically Signed   By: Lupita Raider M.D.   On: 09/11/2020 08:53   DG Ankle Complete Right   Result  Date: 09/11/2020 CLINICAL DATA:  Crush injury to right lower leg. EXAM: RIGHT ANKLE - COMPLETE 3+ VIEW COMPARISON:  None. FINDINGS: Severely displaced and comminuted fractures are seen involving the distal right tibia and fibula. IMPRESSION: Displaced and comminuted distal right tibial and fibular fractures. Electronically Signed   By: Lupita Raider M.D.   On: 09/11/2020 08:54       Review of Systems HENT:  Negative for ear discharge, ear pain, hearing loss and tinnitus.   Eyes:  Negative for photophobia and pain. Respiratory:  Negative for cough and shortness of breath.   Cardiovascular:  Negative for chest pain. Gastrointestinal:  Negative for abdominal pain, nausea and vomiting. Genitourinary:  Negative for dysuria, flank pain, frequency and urgency. Musculoskeletal:  Positive for arthralgias (Right lower leg). Negative for back pain, myalgias and neck pain. Neurological:  Negative for dizziness and headaches. Hematological:  Does not bruise/bleed easily. Psychiatric/Behavioral:  The patient is not nervous/anxious.   Blood pressure (!) 137/102, pulse 84, resp. rate (!) 21, height 5\' 10"  (1.778 m), weight 81.6 kg, SpO2 97 %. Physical Exam Constitutional:      General: He is not in acute distress.    Appearance: He is well-developed. He is not  diaphoretic. HENT:    Head: Normocephalic and atraumatic. Eyes:    General: No scleral icterus.       Right eye: No discharge.        Left eye: No discharge.    Conjunctiva/sclera: Conjunctivae normal. Cardiovascular:    Rate and Rhythm: Normal rate and regular rhythm. Pulmonary:    Effort: Pulmonary effort is normal. No respiratory distress. Musculoskeletal:    Cervical back: Normal range of motion.    Comments: RLE      Transverse laceration mid-shin, no ecchymosis or rash             Mod TTP             No knee effusion, mod medial ankle edema             Knee stable to varus/ valgus and anterior/posterior stress             Sens DPN,  SPN, TN intact             Motor EHL, ext, flex, evers 5/5             DP 1+, PT 0, No significant edema  Skin:    General: Skin is warm and dry. Neurological:    Mental Status: He is alert. Psychiatric:        Mood and Affect: Mood normal.        Behavior: Behavior normal.     Assessment/Plan: Right tib/fib fx, poss open -- Plan I&D, IMN today by Dr. Aundria Rud. Please keep NPO.       Freeman Caldron, PA-C Orthopedic Surgery [phone] 09/11/2020, 10:34 AM         Cosigned by: Yolonda Kida, MD at 09/11/2020  3:27 PM

## 2020-09-11 NOTE — ED Provider Notes (Signed)
[facility] EMERGENCY DEPARTMENT Provider Note   CSN: [id number] Arrival date & time: 09/11/20  0867     History Chief Complaint  Patient presents with   Rt Leg Injury    Nathan Whitney is a 45 y.o. male.  The history is provided by the patient. No language interpreter was used.   45 year old male brought here via EMS for evaluation of leg injury.  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 the bolt that held the suspended weight snapped causing it to drop, landed on his RLE.  Incident happened just prior to arrival.  He reported cute onset of sharp throbbing pain about his right leg.  His coworker who was there was able to lift of the weight using a floor jack.  EMS arrived and brought patient here.  EMS did offer fentanyl pain medication but patient declined.  At this time he states pain is tolerable he does endorse some numbness to his lower extremity.  He is up-to-date with tetanus.  He denies any significant knee pain or hip pain or any other injury.  He denies hitting his head or loss of consciousness.  He is not on any blood thinner medication.  History reviewed. No pertinent past medical history.  There are no problems to display for this patient.   The histories are not reviewed yet. Please review them in the "History" navigator section and refresh this SmartLink.     No family history on file.     Home Medications Prior to Admission medications   Not on File    Allergies    Patient has no allergy information on record.  Review of Systems   Review of Systems  Constitutional:  Negative for fever.  Skin:  Positive for wound.  Neurological:  Positive for numbness.  All other systems reviewed and are negative.  Physical Exam Updated Vital Signs BP (!) 152/105   Pulse 87   Resp 13   Ht 5\' 10"  (1.778 m)   Wt 81.6 kg   SpO2 100%    BMI 25.83 kg/m   Physical Exam Vitals and nursing note reviewed.  Constitutional:      General: He is not in acute distress.    Appearance: He is well-developed.  HENT:     Head: Atraumatic.  Eyes:     Conjunctiva/sclera: Conjunctivae normal.  Cardiovascular:     Rate and Rhythm: Normal rate and regular rhythm.  Pulmonary:     Effort: Pulmonary effort is normal.     Breath sounds: Normal breath sounds.  Abdominal:     General: Abdomen is flat.     Palpations: Abdomen is soft.     Tenderness: There is no abdominal tenderness.  Musculoskeletal:        General: Signs of injury (Right lower extremity: Obvious deformity to mid tib-fib with crepitance and tenderness to palpation.  Small skin laceration noted to the anterior tib-fib without foreign body noted.  Right ankle is edematous but nontender.  DP pulse palpable) present.     Cervical back: Neck supple.     Comments: Right knee and right hip nontender.  Sensation is intact throughout right lower extremity  Skin:    Findings: No rash.  Neurological:     General: No focal deficit present.     Mental Status: He is alert and oriented to person, place, and time.  Psychiatric:  Mood and Affect: Mood normal.    ED Results / Procedures / Treatments   Labs (all labs ordered are listed, but only abnormal results are displayed) Labs Reviewed  BASIC METABOLIC PANEL - Abnormal; Notable for the following components:      Result Value   Glucose, Bld 115 (*)    Calcium 8.7 (*)    All other components within normal limits  CBC WITH DIFFERENTIAL/PLATELET - Abnormal; Notable for the following components:   WBC 10.9 (*)    Neutro Abs 8.9 (*)    All other components within normal limits  SARS CORONAVIRUS 2 (TAT 6-24 HRS)    EKG None  Radiology DG Tibia/Fibula Right  Result Date: 09/11/2020 CLINICAL DATA:  Crush injury of right lower leg. EXAM: RIGHT TIBIA AND FIBULA - 2 VIEW COMPARISON:  None. FINDINGS: Comminuted and severely  displaced fractures are seen involving the distal right tibia and fibula. IMPRESSION: Comminuted and displaced distal right tibial and fibular fractures. Electronically Signed   By: Lupita Raider M.D.   On: 09/11/2020 08:53   DG Ankle Complete Right  Result Date: 09/11/2020 CLINICAL DATA:  Crush injury to right lower leg. EXAM: RIGHT ANKLE - COMPLETE 3+ VIEW COMPARISON:  None. FINDINGS: Severely displaced and comminuted fractures are seen involving the distal right tibia and fibula. IMPRESSION: Displaced and comminuted distal right tibial and fibular fractures. Electronically Signed   By: Lupita Raider M.D.   On: 09/11/2020 08:54    Procedures Procedures   Medications Ordered in ED Medications  HYDROmorphone (DILAUDID) injection 1 mg (has no administration in time range)  ceFAZolin (ANCEF) IVPB 2g/100 mL premix (0 g Intravenous Stopped 09/11/20 0923)  morphine 4 MG/ML injection 4 mg (4 mg Intravenous Given 09/11/20 0846)    ED Course  I have reviewed the triage vital signs and the nursing notes.  Pertinent labs & imaging results that were available during my care of the patient were reviewed by me and considered in my medical decision making (see chart for details).    MDM Rules/Calculators/A&P                          BP (!) 148/106   Pulse 91   Resp 14   Ht 5\' 10"  (1.778 m)   Wt 81.6 kg   SpO2 100%   BMI 25.83 kg/m   Final Clinical Impression(s) / ED Diagnoses Final diagnoses:  Fracture of distal end of tibia with fibula, right, open type I or II, initial encounter    Rx / DC Orders ED Discharge Orders     None      8:24 AM Patient here with crush injury to right lower extremity from a very heavy counterweight which weighed approximately 700 pounds.  It appears he has an open fracture of the right tib-fib from the crush injury without any obvious compartment syndrome.  Sensation is intact throughout.  He is up-to-date with tetanus.  Will obtain appropriate imaging,  give Ancef, keep patient n.p.o.  Pain medication offered but patient declined.  Care discussed with Dr. .  9:20 AM X-ray demonstrate comminuted and displaced distal right tibia and fibula fractures.  This is considered to be an open fracture and therefore antibiotic has been given.  Patient made NPO.  Basic labs ordered.  He has not had COVID vaccine the past, COVID test obtained.  At this time patient is resting comfortably.  Orthopedist has been consulted and will be  involved in the care.  10:48 AM Orthopedic PA has seen and evaluated patient with plan to have patient go to the OR for further management today.  Orthopedists is Dr. Jiles Garter, Greta Doom, PA-C 09/11/20 1049    Lorre Nick, MD 09/16/20 [phone]

## 2020-09-11 NOTE — ED Provider Notes (Signed)
I provided a substantive portion of the care of this patient.  I personally performed the entirety of the medical decision making for this encounter.  45 year old male who presents after having his right leg crushed at work.  He is neurovascular tact at his right foot.  Does have obvious swelling and deformity noted.  Some blood noted.  X-ray does show tib-fib fracture.  Started on Ancef and will consult Orland Dec, MD 09/11/20 [phone]

## 2020-09-12 LAB — HIV ANTIBODY (ROUTINE TESTING W REFLEX): HIV Screen 4th Generation wRfx: NONREACTIVE

## 2020-09-12 MED ORDER — OXYCODONE HCL 5 MG PO TABS: 5.0000 mg | ORAL_TABLET | ORAL | 0 refills | Status: AC | PRN

## 2020-09-12 MED ORDER — ASPIRIN EC 81 MG PO TBEC: 81.0000 mg | DELAYED_RELEASE_TABLET | Freq: Every day | ORAL | 2 refills | Status: AC

## 2020-09-12 MED ORDER — ONDANSETRON 4 MG PO TBDP: 4.0000 mg | ORAL_TABLET | Freq: Three times a day (TID) | ORAL | 0 refills | Status: AC | PRN

## 2020-09-12 MED ORDER — METHOCARBAMOL 500 MG PO TABS: 500.0000 mg | ORAL_TABLET | Freq: Four times a day (QID) | ORAL | 0 refills | Status: AC | PRN

## 2020-09-12 NOTE — Progress Notes (Signed)
    Subjective: 1 Day Post-Op Procedure(s) (LRB): INTRAMEDULLARY (IM) NAIL TIBIAL (Right) IRRIGATION AND DEBRIDEMENT Tibia (Right) Patient reports pain as 2 on 0-10 scale.   Denies CP or SOB.  Voiding without difficulty. Positive flatus. Objective: Vital signs in last 24 hours: Temp:  [97.2 F (36.2 C)-99.3 F (37.4 C)] 98.4 F (36.9 C) (07/02 0348) Pulse Rate:  [70-102] 70 (07/02 0348) Resp:  [10-21] 17 (07/02 0348) BP: (106-163)/(56-129) 118/88 (07/02 0348) SpO2:  [93 %-100 %] 98 % (07/02 0348) Weight:  [81.6 kg] 81.6 kg (07/01 1446)  Intake/Output from previous day: 07/01 0701 - 07/02 0700 In: 1840 [P.O.:440; I.V.:1200; IV Piggyback:200] Out: 1100 [Urine:1000; Blood:100] Intake/Output this shift: No intake/output data recorded.  Labs: Recent Labs    09/11/20 0818  HGB 14.0   Recent Labs    09/11/20 0818  WBC 10.9*  RBC 4.33  HCT 41.5  PLT 228   Recent Labs    09/11/20 0818  NA 137  K 4.2  CL 107  CO2 25  BUN 14  CREATININE 1.16  GLUCOSE 115*  CALCIUM 8.7*   No results for input(s): LABPT, INR in the last 72 hours.  Physical Exam: Neurologically intact ABD soft Intact pulses distally Incision: dressing C/D/I Compartment soft Body mass index is 25.83 kg/m.   Assessment/Plan: 1 Day Post-Op Procedure(s) (LRB): INTRAMEDULLARY (IM) NAIL TIBIAL (Right) IRRIGATION AND DEBRIDEMENT Tibia (Right) Up with therapy Patient currently receiving last IV abx dose Will work with PT this AM and if cleared then d/c to home F/u with Dr Aundria Rud   Alvy Beal for Dr. Venita Lick [facility] [phone] 09/12/2020, 8:44 AM

## 2020-09-12 NOTE — Progress Notes (Signed)
Orthopedic Tech Progress Note Patient Details:  Nathan Whitney [birth year] [id number]  Ortho Devices Type of Ortho Device: Crutches Ortho Device/Splint Location: Right Leg Ortho Device/Splint Interventions: Ordered, Adjustment   Post Interventions Patient Tolerated: Ambulated well  Brittani Purdum E Inigo Lantigua 09/12/2020, 9:39 AM

## 2020-09-12 NOTE — Plan of Care (Signed)

## 2020-09-12 NOTE — Care Management (Signed)
11:30 Received call from Maryella Shivers at [facility], Worker's Comp. [phone] Faxed Op note, PT eval, Progress note at her request to [phone]

## 2020-09-12 NOTE — Evaluation (Addendum)
Physical Therapy Evaluation Patient Details Name: Nathan Whitney MRN: [id number] DOB: 1975/05/15 Today's Date: 09/12/2020   History of Present Illness  Pt is a 45 y.o. M who presents with right type I open tibia midshaft fracture and right fibula midshaft fracture s/p closed reduction and IMN and I&D.  Clinical Impression  Patient evaluated by Physical Therapy with no further acute PT needs identified. Pt reporting fair pain control. Ambulating x 100 feet with crutches utilizing a 3 point gait pattern, with limited weightbearing through RLE with CAM boot donned. Negotiated 1 step with right railing and left crutch to simulate home set up. Reviewed and provided pt with written HEP for RLE ROM/strengthening. Education provided regarding cryotherapy, elevation, weightbearing status, use of CAM boot, wearing left tennis shoe to provide height, appropriate DME. All education has been completed and the patient has no further questions. See below for any follow-up Physical Therapy or equipment needs. PT is signing off. Thank you for this referral.     Follow Up Recommendations Outpatient PT    Equipment Recommendations  Crutches    Recommendations for Other Services       Precautions / Restrictions Precautions Precautions: None Required Braces or Orthoses: Other Brace Other Brace: R CAM boot Restrictions Weight Bearing Restrictions: Yes RLE Weight Bearing: Weight bearing as tolerated      Mobility  Bed Mobility Overal bed mobility: Needs Assistance Bed Mobility: Supine to Sit;Sit to Supine     Supine to sit: Min assist Sit to supine: Min assist   General bed mobility comments: MinA for RLE negotiation into and out of bed    Transfers Overall transfer level: Modified independent Equipment used: Crutches                Ambulation/Gait Ambulation/Gait assistance: Modified independent (Device/Increase time) Gait Distance (Feet): 100 Feet Assistive device: Crutches Gait  Pattern/deviations: Step-to pattern     General Gait Details: Cues for 3 point gait pattern, use of crutches, upright posture and trunk control  Stairs Stairs: Yes Stairs assistance: Supervision Stair Management: One rail Right;With crutches Number of Stairs: 1 General stair comments: Cues for technique, use of R rail and L crutch to simulate home set up  Wheelchair Mobility    Modified Rankin (Stroke Patients Only)       Balance Overall balance assessment: No apparent balance deficits (not formally assessed)                                           Pertinent Vitals/Pain Pain Assessment: Faces Faces Pain Scale: Hurts even more Pain Location: R distal LE Pain Descriptors / Indicators: Grimacing;Operative site guarding Pain Intervention(s): Limited activity within patient's tolerance;Monitored during session;Ice applied;Repositioned    Home Living Family/patient expects to be discharged to:: Private residence Living Arrangements: Spouse/significant other Available Help at Discharge: Family Type of Home: House Home Access: Stairs to enter Entrance Stairs-Rails: Doctor, general practice of Steps: 3 Home Layout: One level Home Equipment: None      Prior Function Level of Independence: Independent         Comments: Works as a Web designer Extremity Assessment Upper Extremity Assessment: Overall WFL for tasks assessed    Lower Extremity Assessment Lower Extremity Assessment: RLE deficits/detail RLE Deficits / Details: Open tib fib fx s/p IMN.  Able to wiggle toes, limited knee flexion to ~30 degrees, increased edema    Cervical / Trunk Assessment Cervical / Trunk Assessment: Normal  Communication   Communication: No difficulties  Cognition Arousal/Alertness: Awake/alert Behavior During Therapy: WFL for tasks assessed/performed Overall Cognitive Status: Within Functional  Limits for tasks assessed                                        General Comments      Exercises     Assessment/Plan    PT Assessment Patent does not need any further PT services  PT Problem List         PT Treatment Interventions      PT Goals (Current goals can be found in the Care Plan section)  Acute Rehab PT Goals Patient Stated Goal: return home PT Goal Formulation: All assessment and education complete, DC therapy    Frequency     Barriers to discharge        Co-evaluation               AM-PAC PT "6 Clicks" Mobility  Outcome Measure Help needed turning from your back to your side while in a flat bed without using bedrails?: None Help needed moving from lying on your back to sitting on the side of a flat bed without using bedrails?: A Little Help needed moving to and from a bed to a chair (including a wheelchair)?: None Help needed standing up from a chair using your arms (e.g., wheelchair or bedside chair)?: None Help needed to walk in hospital room?: None Help needed climbing 3-5 steps with a railing? : A Little 6 Click Score: 22    End of Session Equipment Utilized During Treatment: Other (comment) (CAM boot) Activity Tolerance: Patient tolerated treatment well Patient left: in bed;with call bell/phone within reach Nurse Communication: Mobility status PT Visit Diagnosis: Difficulty in walking, not elsewhere classified (R26.2);Pain Pain - Right/Left: Right Pain - part of body: Leg    Time: 0940-1010 PT Time Calculation (min) (ACUTE ONLY): 30 min   Charges:   PT Evaluation $PT Eval Low Complexity: 1 Low PT Treatments $Gait Training: 8-22 mins        Lillia Pauls, PT, DPT Acute Rehabilitation Services Pager [phone] Office [phone]   Norval Morton 09/12/2020, 11:01 AM

## 2020-09-14 NOTE — Anesthesia Postprocedure Evaluation (Signed)
Anesthesia Post Note  Patient: Nathan Whitney  Procedure(s) Performed: INTRAMEDULLARY (IM) NAIL TIBIAL (Right) IRRIGATION AND DEBRIDEMENT Tibia (Right)     Anesthesia Post Evaluation No notable events documented.  Last Vitals:  Vitals:   09/11/20 2010 09/12/20 0348  BP: (!) 147/94 118/88  Pulse: 89 70  Resp: 18 17  Temp: 37.4 C 36.9 C  SpO2: 100% 98%    Last Pain:  Vitals:   09/12/20 0608  TempSrc:   PainSc: Asleep                 Basilia Stuckert

## 2020-09-16 ENCOUNTER — Encounter (HOSPITAL_COMMUNITY): Payer: Self-pay | Admitting: Orthopedic Surgery

## 2020-09-16 NOTE — Discharge Summary (Signed)
Patient ID: Nathan Whitney MRN: [id number] DOB/AGE: [birth year] 45 y.o.  Admit date: 09/11/2020 Discharge date: 09/12/2020  Primary Diagnosis: Right type 1 open tibia midshaft fracture  Admission Diagnoses: s/p Right IM tibial nail for open fracture History reviewed. No pertinent past medical history. Discharge Diagnoses:   Active Problems:   Open tibial fracture  Estimated body mass index is 25.83 kg/m as calculated from the following:   Height as of this encounter: 5\' 10"  (1.778 m).   Weight as of this encounter: 81.6 kg.  Procedure:  Procedure(s) (LRB): INTRAMEDULLARY (IM) NAIL TIBIAL (Right) IRRIGATION AND DEBRIDEMENT Tibia (Right)   Consults: None  HPI: Nathan Whitney  is a 45 year old male who presented to the ER on 09/11/2020 after a work-related accident when he sustained a right type I open tibia fracture and associated midshaft fibula fracture.  He was doing his duties when a counterweight from a forklift fell onto his right leg.  He had immediate pain and deformity.  He was evaluated and recommended operative treatment.  Patient underwent right tibia open irrigation and excisional debridement for open fracture . He was admitted for antibiotics per open fracture protocol and  post op pain control and monitoring.  Laboratory Data: Admission on 09/11/2020, Discharged on 09/12/2020  Component Date Value Ref Range Status   Sodium 09/11/2020 137  135 - 145 mmol/L Final   Potassium 09/11/2020 4.2  3.5 - 5.1 mmol/L Final   Chloride 09/11/2020 107  98 - 111 mmol/L Final   CO2 09/11/2020 25  22 - 32 mmol/L Final   Glucose, Bld 09/11/2020 115 (A) 70 - 99 mg/dL Final   Glucose reference range applies only to samples taken after fasting for at least 8 hours.   BUN 09/11/2020 14  6 - 20 mg/dL Final   Creatinine, Ser 09/11/2020 1.16  0.61 - 1.24 mg/dL Final   Calcium 11/12/2020 8.7 (A) 8.9 - 10.3 mg/dL Final   GFR, Estimated 09/11/2020 >60  >60 mL/min Final   Comment: (NOTE) Calculated  using the CKD-EPI Creatinine Equation (2021)    Anion gap 09/11/2020 5  5 - 15 Final   Performed at [facility], [address], [place], [place]   WBC 09/11/2020 10.9 (A) 4.0 - 10.5 K/uL Final   RBC 09/11/2020 4.33  4.22 - 5.81 MIL/uL Final   Hemoglobin 09/11/2020 14.0  13.0 - 17.0 g/dL Final   HCT 11/12/2020 41.5  39.0 - 52.0 % Final   MCV 09/11/2020 95.8  80.0 - 100.0 fL Final   MCH 09/11/2020 32.3  26.0 - 34.0 pg Final   MCHC 09/11/2020 33.7  30.0 - 36.0 g/dL Final   RDW 11/12/2020 12.4  11.5 - 15.5 % Final   Platelets 09/11/2020 228  150 - 400 K/uL Final   nRBC 09/11/2020 0.0  0.0 - 0.2 % Final   Neutrophils Relative % 09/11/2020 82  % Final   Neutro Abs 09/11/2020 8.9 (A) 1.7 - 7.7 K/uL Final   Lymphocytes Relative 09/11/2020 11  % Final   Lymphs Abs 09/11/2020 1.2  0.7 - 4.0 K/uL Final   Monocytes Relative 09/11/2020 5  % Final   Monocytes Absolute 09/11/2020 0.6  0.1 - 1.0 K/uL Final   Eosinophils Relative 09/11/2020 1  % Final   Eosinophils Absolute 09/11/2020 0.2  0.0 - 0.5 K/uL Final   Basophils Relative 09/11/2020 0  % Final   Basophils Absolute 09/11/2020 0.0  0.0 - 0.1 K/uL Final   Immature Granulocytes 09/11/2020 1  %  Final   Abs Immature Granulocytes 09/11/2020 0.06  0.00 - 0.07 K/uL Final   Performed at [facility], [address] [place], [place] [address]   SARS Coronavirus 2 09/11/2020 NEGATIVE  NEGATIVE Final   Comment: (NOTE) SARS-CoV-2 target nucleic acids are NOT DETECTED.  The SARS-CoV-2 RNA is generally detectable in upper and lower respiratory specimens during the acute phase of infection. Negative results do not preclude SARS-CoV-2 infection, do not rule out co-infections with other pathogens, and should not be used as the sole basis for treatment or other patient management decisions. Negative results must be combined with clinical observations, patient history, and epidemiological information. The expected result is  Negative.  Fact Sheet for Patients: HairSlick.nohttps://www.fda.gov/media/138098/download  Fact Sheet for Healthcare Providers: quierodirigir.comhttps://www.fda.gov/media/138095/download  This test is not yet approved or cleared by the [place] FDA and  has been authorized for detection and/or diagnosis of SARS-CoV-2 by FDA under an Emergency Use Authorization (EUA). This EUA will remain  in effect (meaning this test can be used) for the duration of the COVID-19 declaration under Se                          ction 564(b)(1) of the Act, 21 U.S.C. section 360bbb-3(b)(1), unless the authorization is terminated or revoked sooner.  Performed at [facility], [address] [place], [place] [address]    HIV Screen 4th Generation wRfx 09/11/2020 Non Reactive  Non Reactive Final   Performed at [facility], [address] [place], [place] [address]     X-Rays:DG Tibia/Fibula Right  Result Date: 09/11/2020 CLINICAL DATA:  Right tibia IM nail. EXAM: RIGHT TIBIA AND FIBULA - 2 VIEW; DG C-ARM 1-60 MIN COMPARISON:  Radiographs earlier today. FINDINGS: Six fluoroscopic spot views of the right tibia and fibula obtained in the operating room. Intramedullary nail with distal and proximal locking screws traverse tibial shaft fracture. Fibular fracture is in improved alignment. Fluoroscopy time 100 seconds. Dose 3.18 mGy. IMPRESSION: Fluoroscopic spot views during intramedullary nail fixation of tibial shaft fracture. Electronically Signed   By: Narda RutherfordMelanie  Sanford M.D.   On: 09/11/2020 18:10   DG Tibia/Fibula Right  Result Date: 09/11/2020 CLINICAL DATA:  Crush injury of right lower leg. EXAM: RIGHT TIBIA AND FIBULA - 2 VIEW COMPARISON:  None. FINDINGS: Comminuted and severely displaced fractures are seen involving the distal right tibia and fibula. IMPRESSION: Comminuted and displaced distal right tibial and fibular fractures. Electronically Signed   By: Lupita RaiderJames  Green Jr M.D.   On: 09/11/2020 08:53   DG Ankle Complete  Right  Result Date: 09/11/2020 CLINICAL DATA:  Crush injury to right lower leg. EXAM: RIGHT ANKLE - COMPLETE 3+ VIEW COMPARISON:  None. FINDINGS: Severely displaced and comminuted fractures are seen involving the distal right tibia and fibula. IMPRESSION: Displaced and comminuted distal right tibial and fibular fractures. Electronically Signed   By: Lupita RaiderJames  Green Jr M.D.   On: 09/11/2020 08:54   DG Tibia/Fibula Right Port  Result Date: 09/11/2020 CLINICAL DATA:  Postop. EXAM: PORTABLE RIGHT TIBIA AND FIBULA - 2 VIEW COMPARISON:  Preoperative radiographs earlier today FINDINGS: Intramedullary nail with proximal and distal locking screws traverse distal tibial shaft fracture. Fracture is in improved alignment from preoperative imaging. Distal fibular shaft fracture is in slightly improved alignment. Recent postsurgical change includes air and edema in the soft tissues and skin staples in place. IMPRESSION: Intramedullary nail and screw fixation of distal tibial shaft  fracture, without immediate postoperative complication. Electronically Signed   By: Narda Rutherford M.D.   On: 09/11/2020 19:30   DG C-Arm 1-60 Min  Result Date: 09/11/2020 CLINICAL DATA:  Right tibia IM nail. EXAM: RIGHT TIBIA AND FIBULA - 2 VIEW; DG C-ARM 1-60 MIN COMPARISON:  Radiographs earlier today. FINDINGS: Six fluoroscopic spot views of the right tibia and fibula obtained in the operating room. Intramedullary nail with distal and proximal locking screws traverse tibial shaft fracture. Fibular fracture is in improved alignment. Fluoroscopy time 100 seconds. Dose 3.18 mGy. IMPRESSION: Fluoroscopic spot views during intramedullary nail fixation of tibial shaft fracture. Electronically Signed   By: Narda Rutherford M.D.   On: 09/11/2020 18:10    EKG:No orders found for this or any previous visit.   Hospital Course: Nathan Whitney is a 45 y.o. who was admitted to Hospital. They were brought to the operating room on 09/11/2020 and underwent  Procedure(s): INTRAMEDULLARY (IM) NAIL TIBIAL IRRIGATION AND DEBRIDEMENT Tibia.  Patient tolerated the procedure well and was later transferred to the recovery room and then to the orthopaedic floor for postoperative care.  They were given PO and IV analgesics for pain control following their surgery.  They were given 24 hours of postoperative antibiotics of  Anti-infectives (From admission, onward)    Start     Dose/Rate Route Frequency Ordered Stop   09/12/20 0000  ceFAZolin (ANCEF) IVPB 2g/100 mL premix  Status:  Discontinued        2 g 200 mL/hr over 30 Minutes Intravenous Every 8 hours 09/11/20 1537 09/12/20 1603   09/11/20 1500  ceFAZolin (ANCEF) IVPB 2g/100 mL premix        2 g 200 mL/hr over 30 Minutes Intravenous On call to O.R. 09/11/20 1437 09/11/20 1644   09/11/20 1442  ceFAZolin (ANCEF) 2-4 GM/100ML-% IVPB       Note to Pharmacy: Randa Ngo   : cabinet override      09/11/20 1442 09/12/20 0244   09/11/20 0830  ceFAZolin (ANCEF) IVPB 2g/100 mL premix        2 g 200 mL/hr over 30 Minutes Intravenous  Once 09/11/20 0818 09/11/20 0923      and started on DVT prophylaxis in the form of Aspirin.   PT was ordered for evaluation and gait training.  Patient had an uneventful night on the evening of surgery.  Patient was seen by PT on post day 1.  Patient was seen in rounds and was doing well and ready for discharge.  Patient stayed in the hospital to receive his 24 hours of antibiotic for open fracture per protocol.   Diet: Regular diet Activity:WBAT Follow-up:in 2 weeks Disposition - Home Discharged Condition: good    Allergies as of 09/12/2020   No Known Allergies      Medication List     STOP taking these medications    ibuprofen 200 MG tablet Commonly known as: ADVIL       TAKE these medications    aspirin EC 81 MG tablet Take 1 tablet (81 mg total) by mouth daily. Start on POD #2   methocarbamol 500 MG tablet Commonly known as: Robaxin Take 1 tablet  (500 mg total) by mouth every 6 (six) hours as needed for muscle spasms.   ondansetron 4 MG disintegrating tablet Commonly known as: Zofran ODT Take 1 tablet (4 mg total) by mouth every 8 (eight) hours as needed.   oxyCODONE 5 MG immediate release tablet Commonly known as: Roxicodone Take 1  tablet (5 mg total) by mouth every 4 (four) hours as needed for severe pain or breakthrough pain.        Follow-up Information     Yolonda Kida, MD Follow up in 2 week(s).   Specialty: Orthopedic Surgery Why: For suture removal, For wound re-check Contact information: [address] [place] [address] [place] [address] [phone]                 Signed: Dion Saucier PA-C Orthopaedic Surgery 09/16/2020, 4:38 PM

## 2022-10-26 IMAGING — DX DG ANKLE COMPLETE 3+V*R*
3 series · 3 of 3 positions shown · non-contrast
Comparison: None.

CLINICAL DATA: Crush injury to right lower leg.

EXAM:
RIGHT ANKLE - COMPLETE 3+ VIEW

[x ankle obl right]
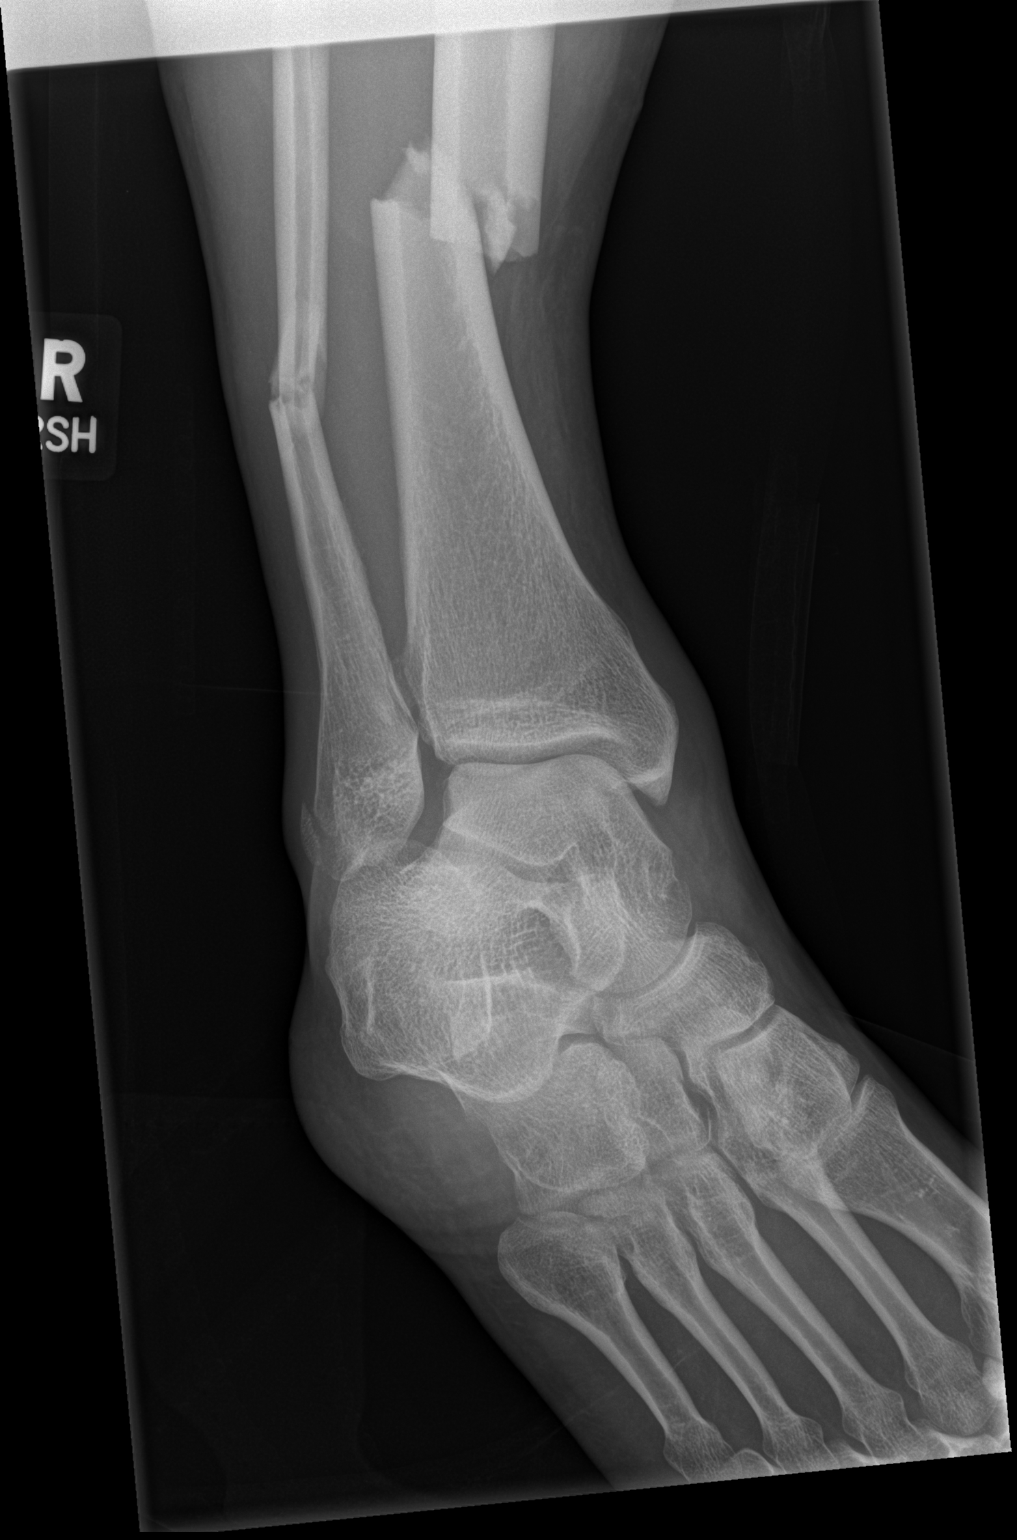

[x ankle ap right]
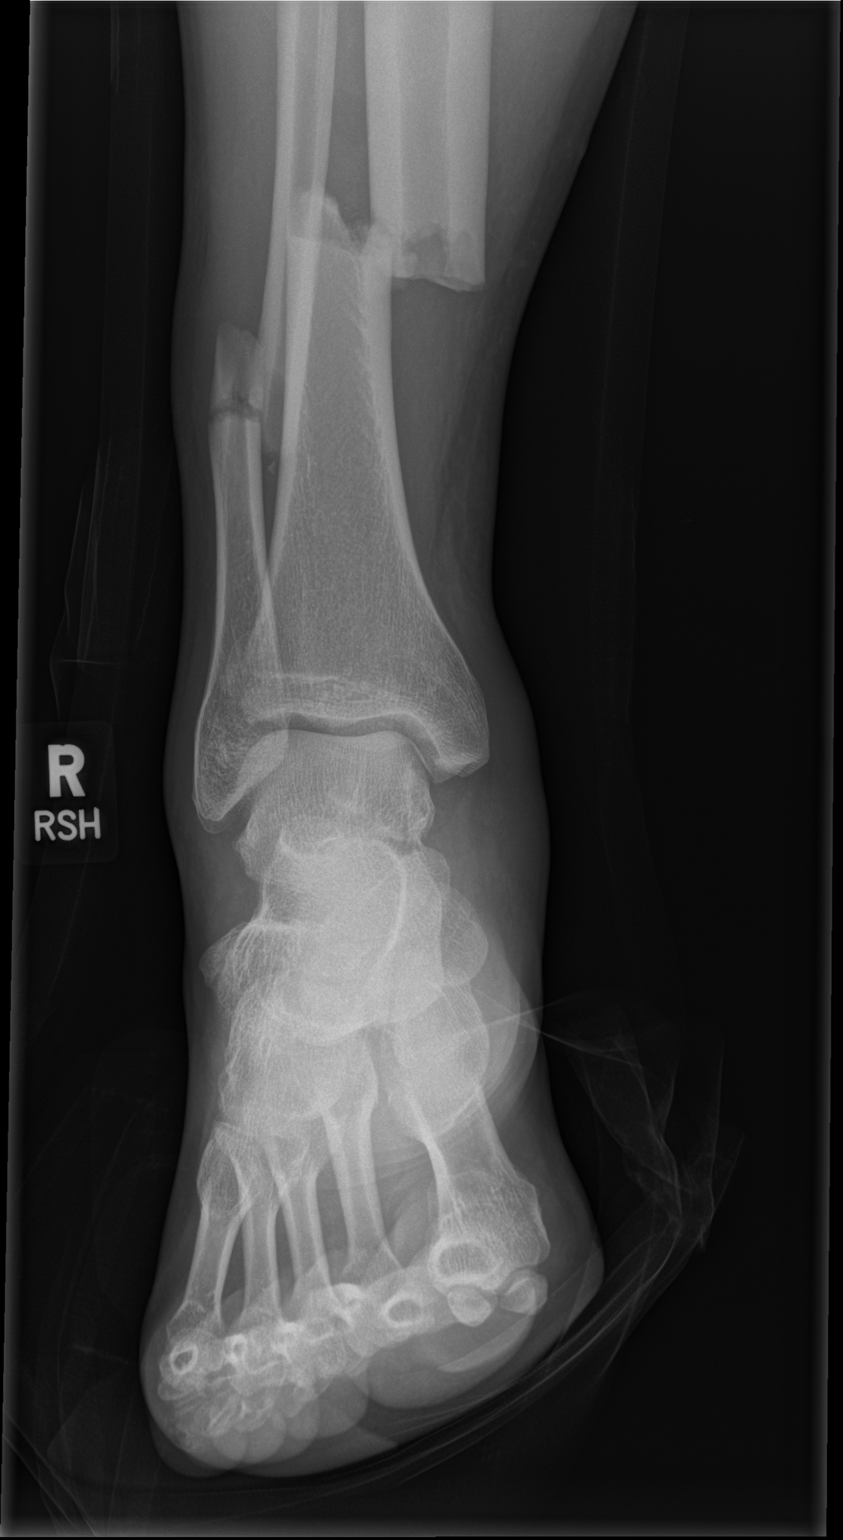

[x ankle lat right]
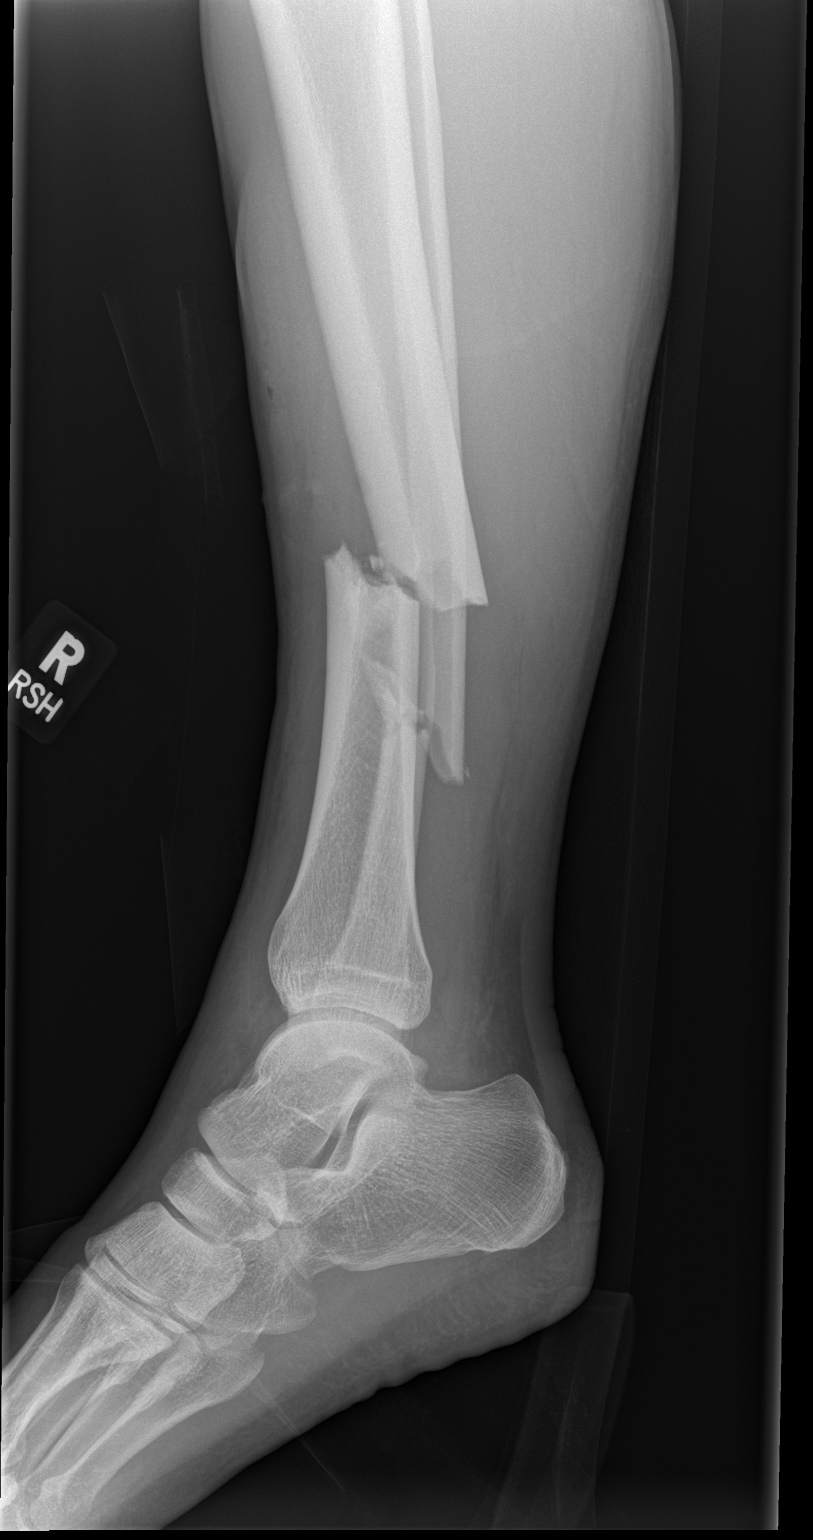

[3 of 3 positions shown; findings below may reference images not displayed]

FINDINGS: Severely displaced and comminuted fractures are seen involving the
distal right tibia and fibula.
IMPRESSION: Displaced and comminuted distal right tibial and fibular fractures.

## 2022-10-26 IMAGING — DX DG TIBIA/FIBULA PORT 2V*R*
1 series · 4 of 4 positions shown · non-contrast
Comparison: Preoperative radiographs earlier today

CLINICAL DATA: Postop.

EXAM:
PORTABLE RIGHT TIBIA AND FIBULA - 2 VIEW

[Series 1: leg · 0.14mm/px · 4 of 4 slices shown]
[im 1/4]
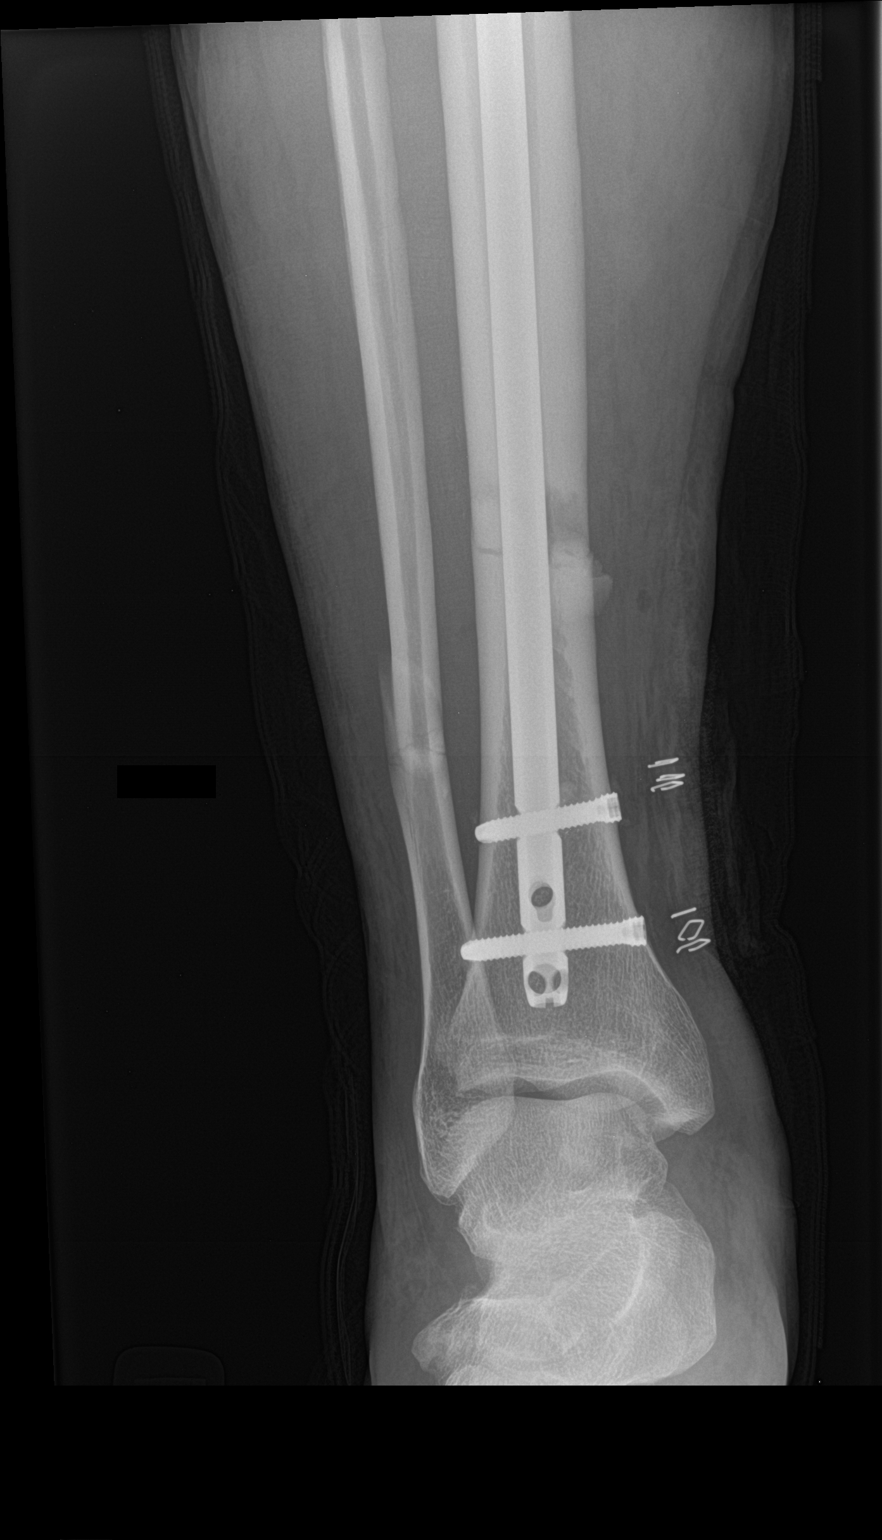
[im 2/4]
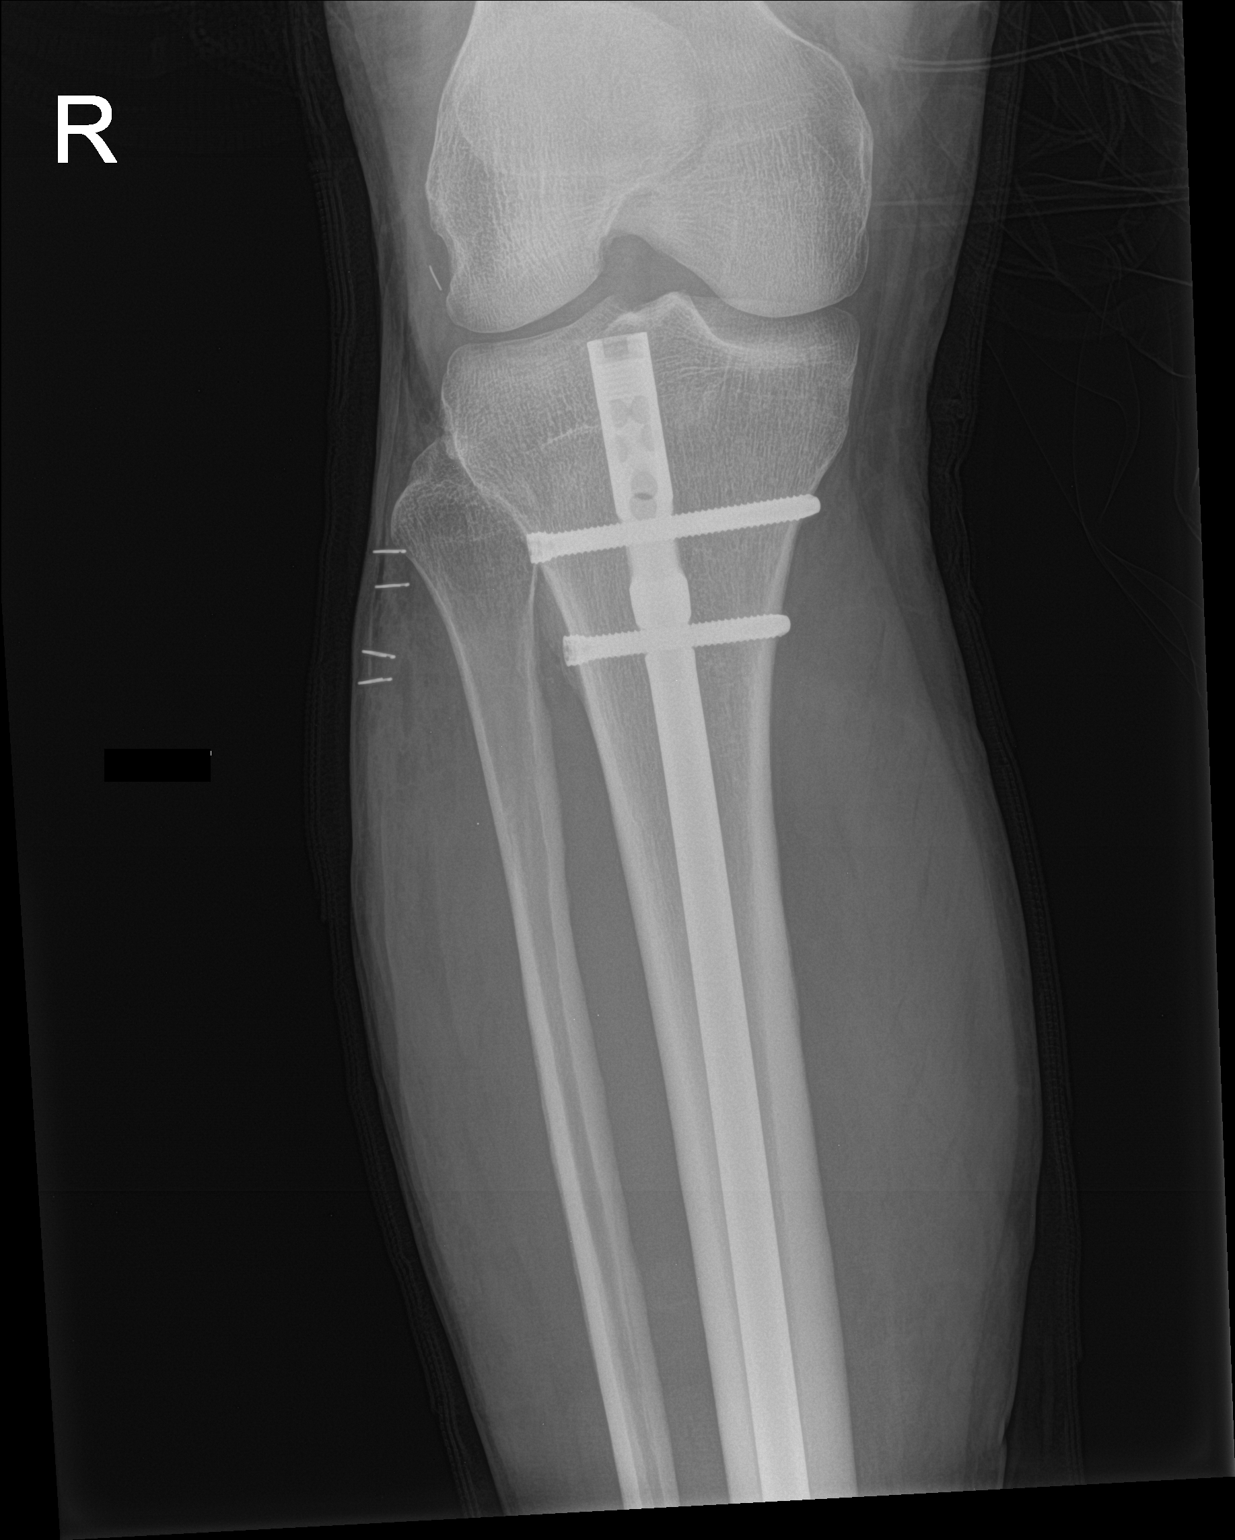
[im 3/4]
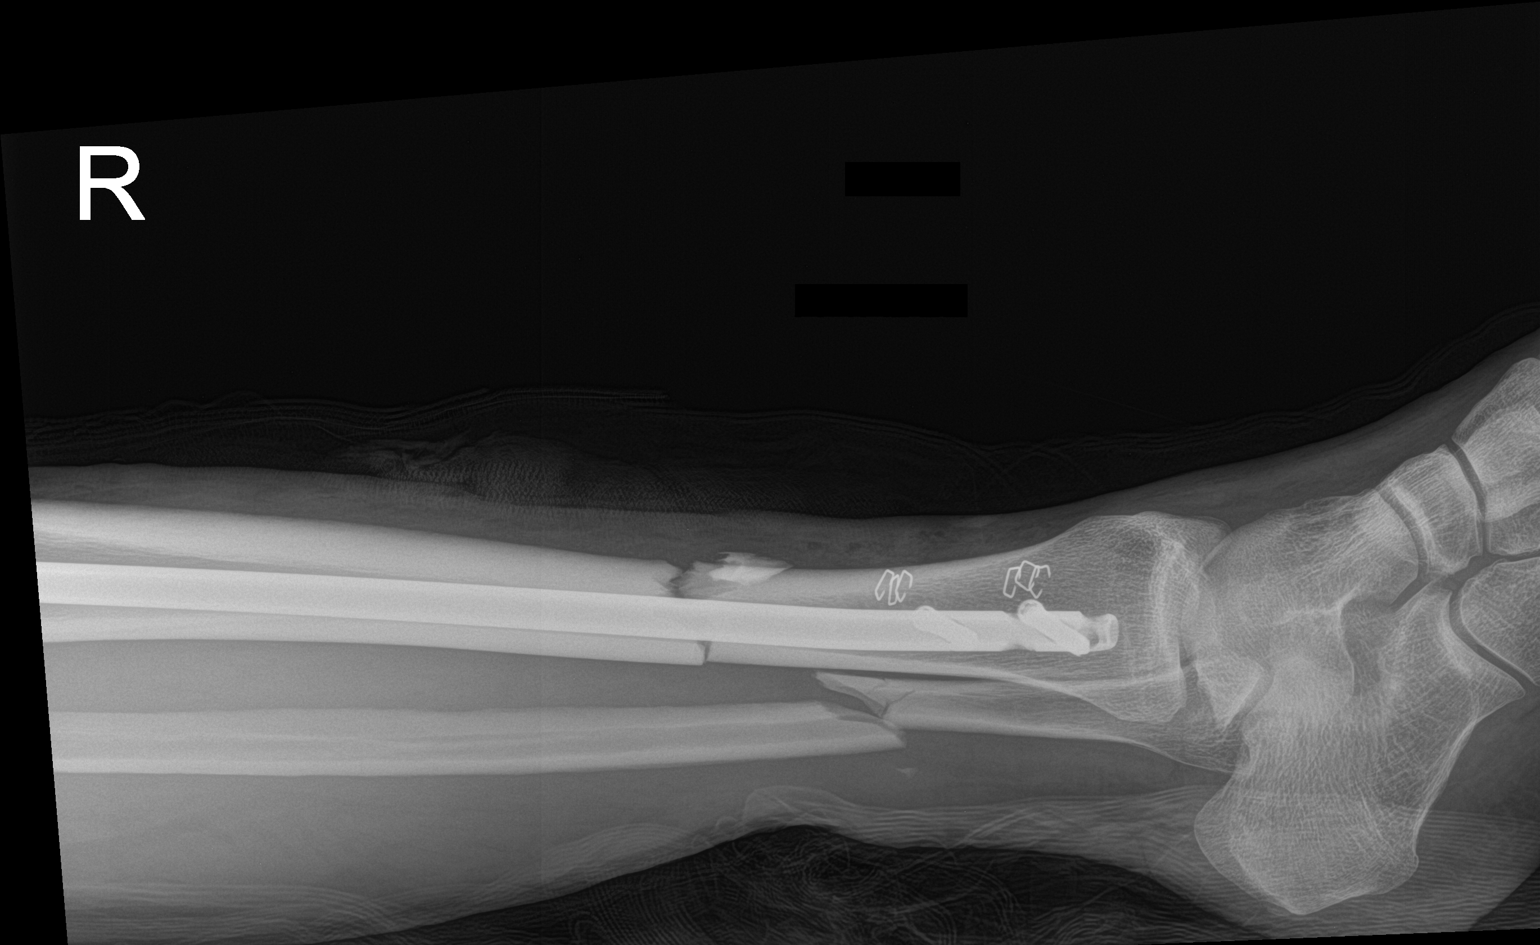
[im 4/4]
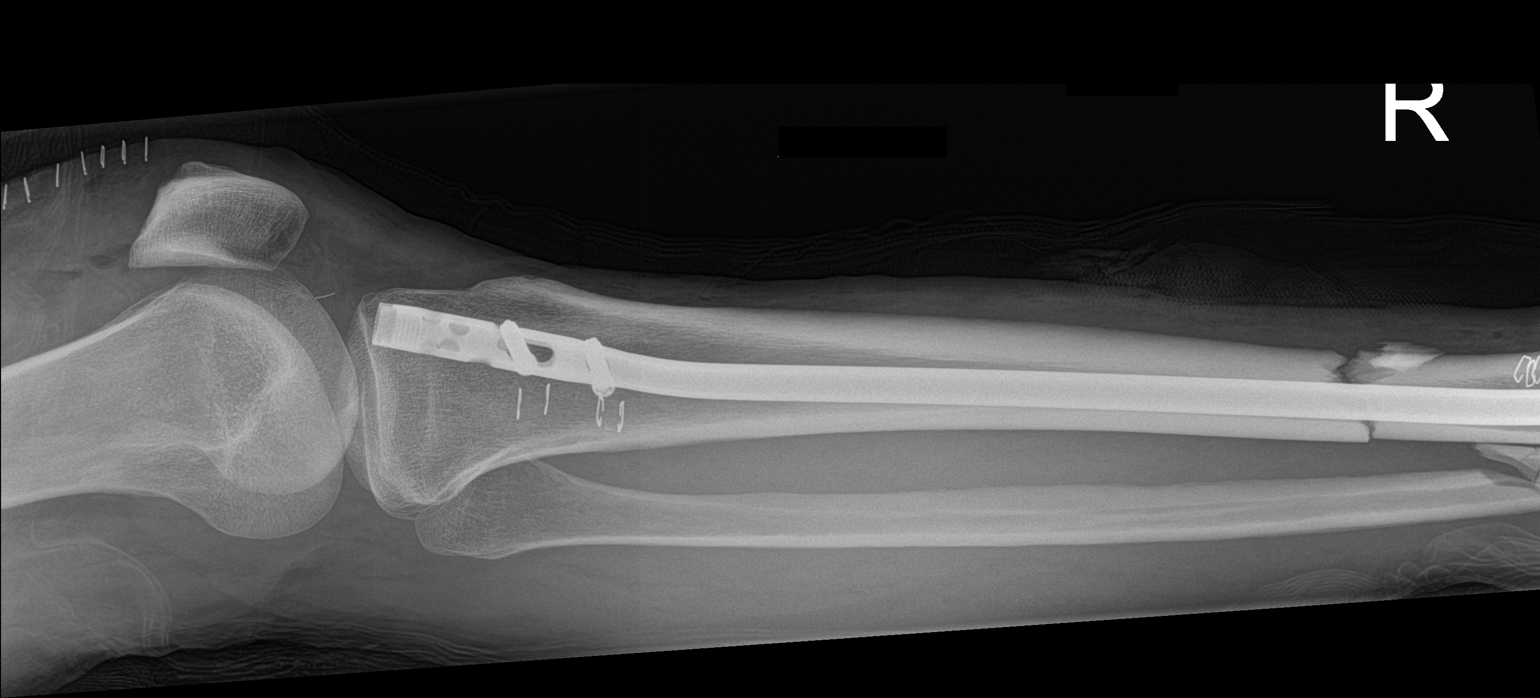

[4 of 4 positions shown; findings below may reference images not displayed]

FINDINGS: Intramedullary nail with proximal and distal locking screws traverse
distal tibial shaft fracture. Fracture is in improved alignment from
preoperative imaging. Distal fibular shaft fracture is in slightly
improved alignment. Recent postsurgical change includes air and
edema in the soft tissues and skin staples in place.
IMPRESSION: Intramedullary nail and screw fixation of distal tibial shaft
fracture, without immediate postoperative complication.

## 2022-10-26 IMAGING — RF DG TIBIA/FIBULA 2V*R*
1 series · 6 of 6 positions shown · non-contrast
Comparison: Radiographs earlier today.

CLINICAL DATA: Right tibia IM nail.

EXAM:
RIGHT TIBIA AND FIBULA - 2 VIEW; DG C-ARM 1-60 MIN

[Series 1: run · 6 of 6 slices shown]
[im 1/6]
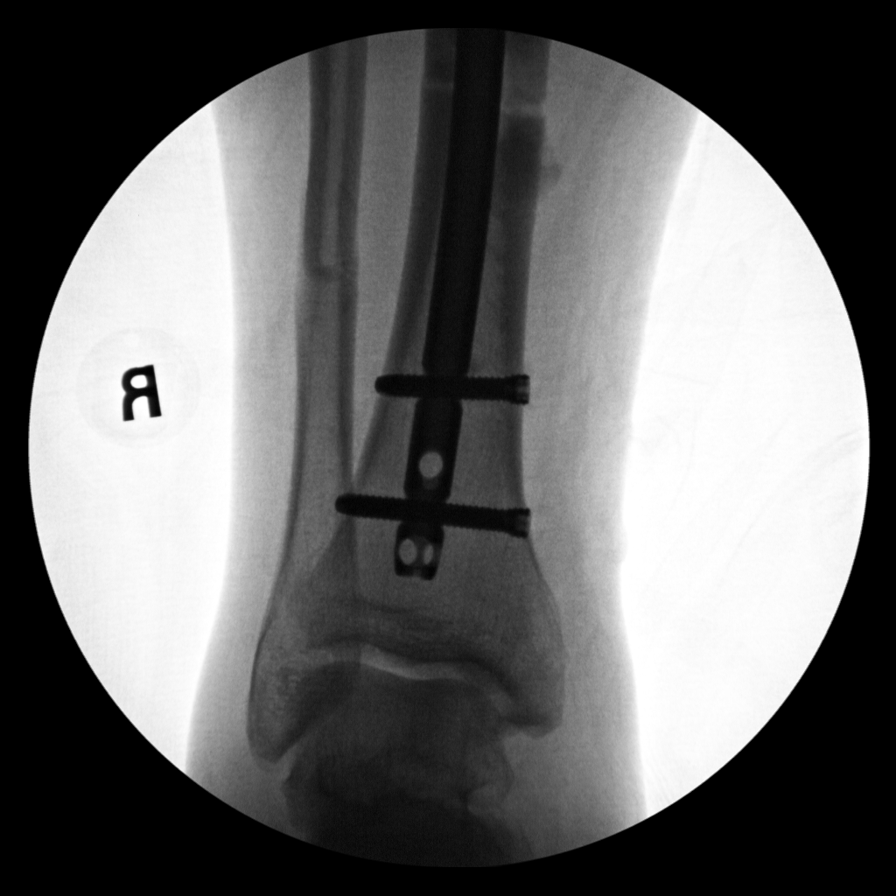
[im 2/6]
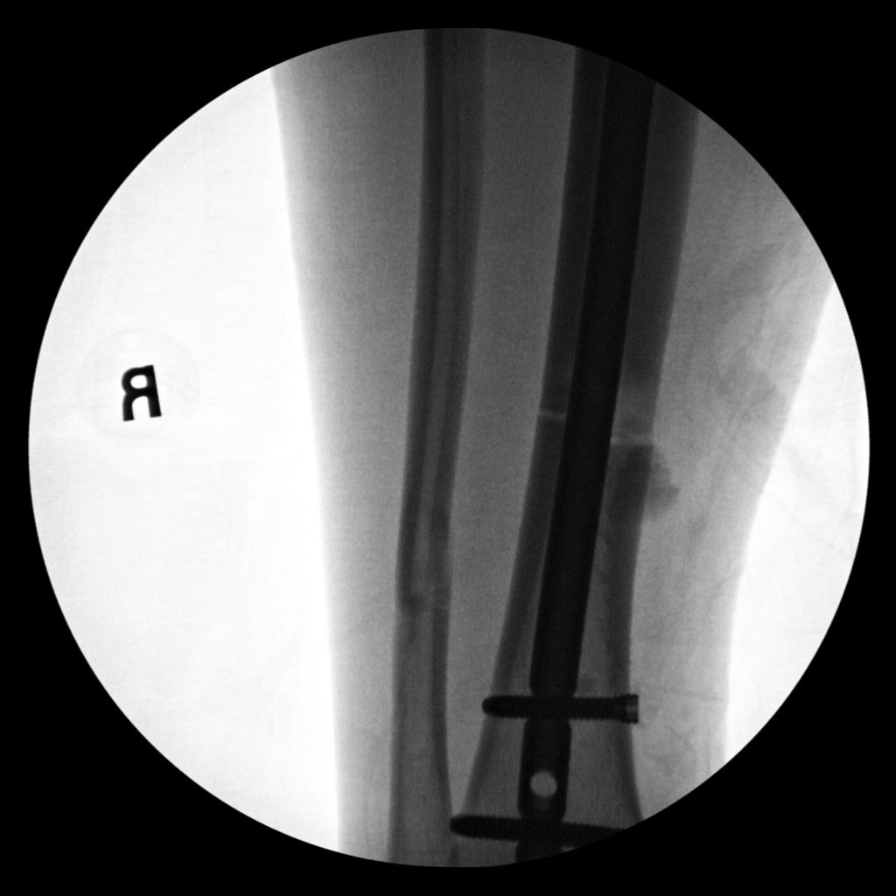
[im 3/6]
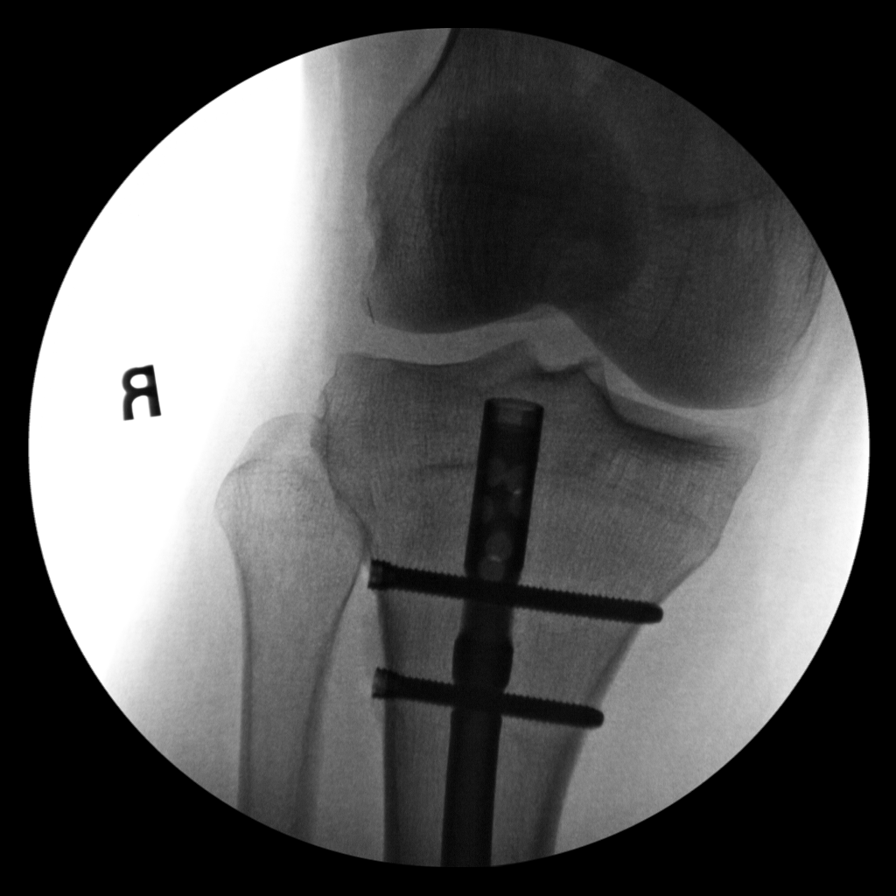
[im 4/6]
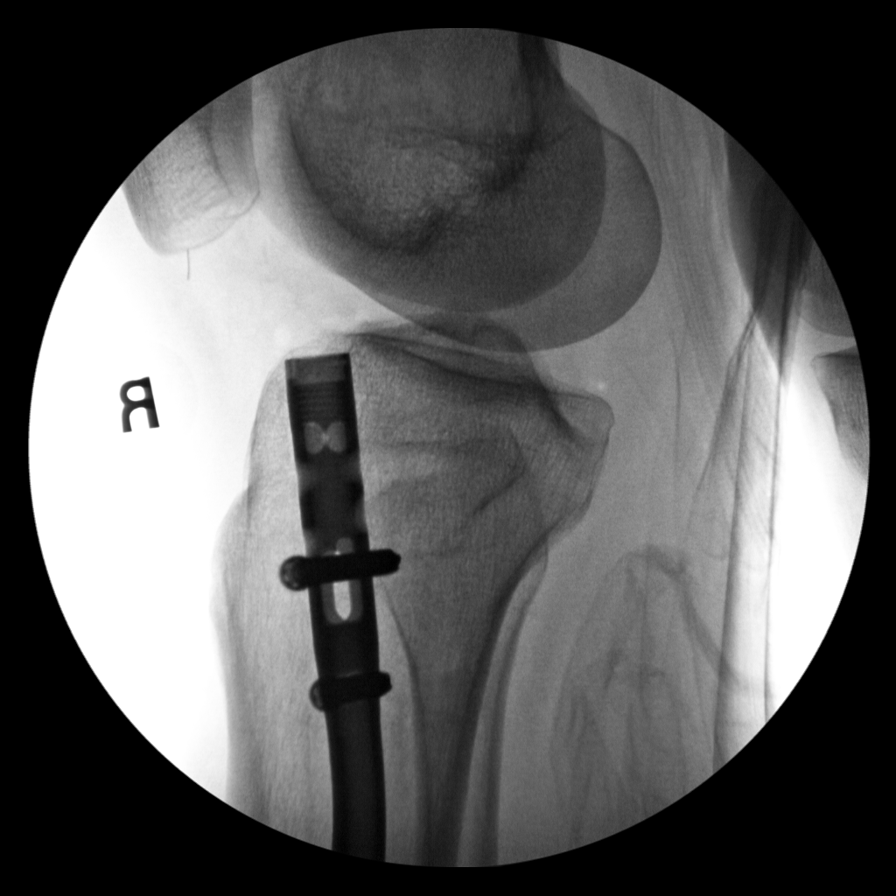
[im 5/6]
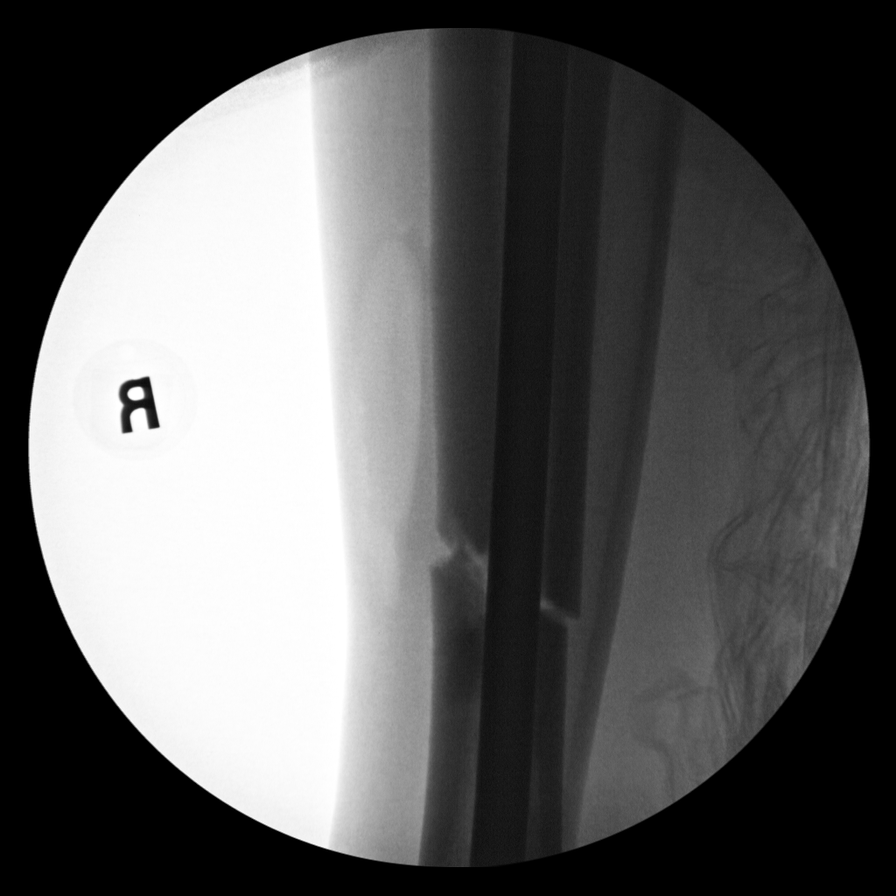
[im 6/6]
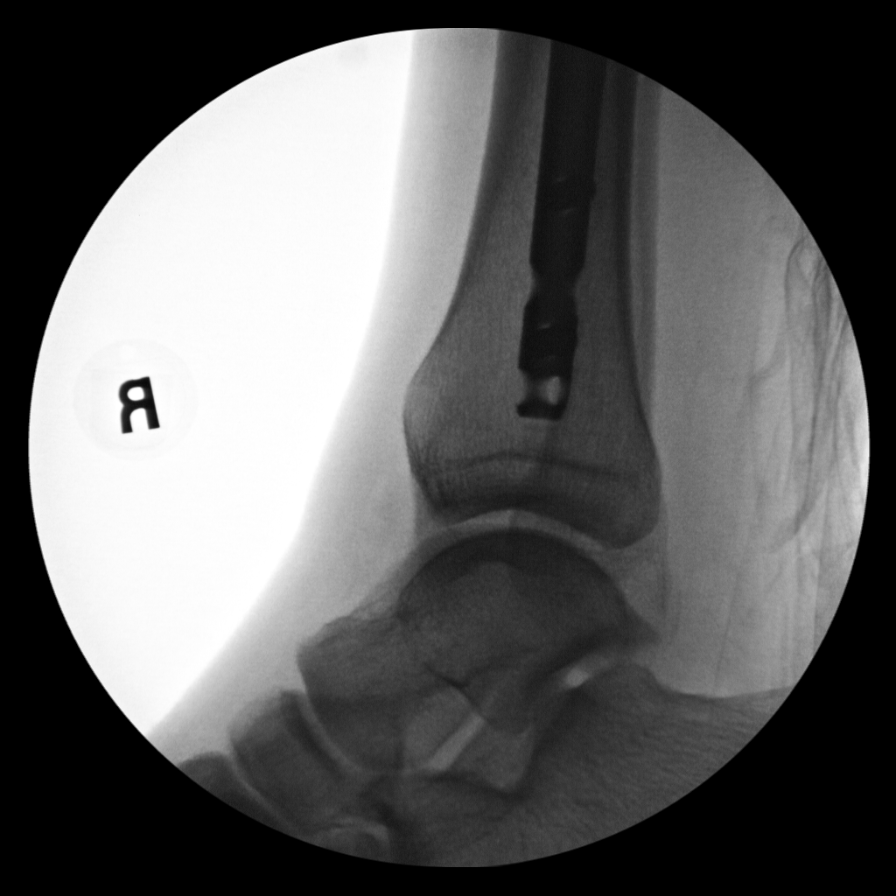

[6 of 6 positions shown; findings below may reference images not displayed]

FINDINGS: Six fluoroscopic spot views of the right tibia and fibula obtained
in the operating room. Intramedullary nail with distal and proximal
locking screws traverse tibial shaft fracture. Fibular fracture is
in improved alignment. Fluoroscopy time 100 seconds. Dose 3.18 mGy.
IMPRESSION: Fluoroscopic spot views during intramedullary nail fixation of
tibial shaft fracture.
